# Patient Record
Sex: Female | Born: 1965 | Race: Black or African American | Hispanic: No | Marital: Single | Smoking: Former smoker
Health system: Southern US, Community
[De-identification: ages and names within clinical notes are randomized; demographics above are authoritative.]

## PROBLEM LIST (undated history)

## (undated) DIAGNOSIS — I10 Essential (primary) hypertension: Secondary | ICD-10-CM

## (undated) DIAGNOSIS — D649 Anemia, unspecified: Secondary | ICD-10-CM

## (undated) DIAGNOSIS — T7840XA Allergy, unspecified, initial encounter: Secondary | ICD-10-CM

## (undated) DIAGNOSIS — K5790 Diverticulosis of intestine, part unspecified, without perforation or abscess without bleeding: Secondary | ICD-10-CM

## (undated) HISTORY — DX: Diverticulosis of intestine, part unspecified, without perforation or abscess without bleeding: K57.90

## (undated) HISTORY — DX: Allergy, unspecified, initial encounter: T78.40XA

## (undated) HISTORY — DX: Anemia, unspecified: D64.9

## (undated) HISTORY — PX: FOOT SURGERY: SHX648

---

## 1998-10-14 ENCOUNTER — Other Ambulatory Visit: Admission: RE | Admit: 1998-10-14 | Discharge: 1998-10-14 | Payer: Self-pay | Admitting: *Deleted

## 1999-11-24 ENCOUNTER — Other Ambulatory Visit: Admission: RE | Admit: 1999-11-24 | Discharge: 1999-11-24 | Payer: Self-pay | Admitting: *Deleted

## 2000-12-26 ENCOUNTER — Other Ambulatory Visit: Admission: RE | Admit: 2000-12-26 | Discharge: 2000-12-26 | Payer: Self-pay | Admitting: *Deleted

## 2001-12-30 ENCOUNTER — Other Ambulatory Visit: Admission: RE | Admit: 2001-12-30 | Discharge: 2001-12-30 | Payer: Self-pay | Admitting: *Deleted

## 2003-01-14 ENCOUNTER — Other Ambulatory Visit: Admission: RE | Admit: 2003-01-14 | Discharge: 2003-01-14 | Payer: Self-pay | Admitting: *Deleted

## 2005-05-09 ENCOUNTER — Ambulatory Visit (HOSPITAL_COMMUNITY): Admission: RE | Admit: 2005-05-09 | Discharge: 2005-05-09 | Payer: Self-pay | Admitting: Obstetrics and Gynecology

## 2005-05-21 ENCOUNTER — Inpatient Hospital Stay (HOSPITAL_COMMUNITY): Admission: AD | Admit: 2005-05-21 | Discharge: 2005-05-25 | Payer: Self-pay | Admitting: Obstetrics & Gynecology

## 2011-02-17 ENCOUNTER — Other Ambulatory Visit: Payer: Self-pay | Admitting: Family Medicine

## 2011-02-17 ENCOUNTER — Encounter: Payer: PRIVATE HEALTH INSURANCE | Admitting: Obstetrics & Gynecology

## 2011-02-17 ENCOUNTER — Other Ambulatory Visit: Payer: Self-pay | Admitting: Obstetrics & Gynecology

## 2011-02-17 DIAGNOSIS — Z1231 Encounter for screening mammogram for malignant neoplasm of breast: Secondary | ICD-10-CM

## 2011-02-17 DIAGNOSIS — Z01419 Encounter for gynecological examination (general) (routine) without abnormal findings: Secondary | ICD-10-CM

## 2011-02-18 NOTE — Group Therapy Note (Signed)
Sonia Murphy, BERK NO.:  [id number]  MEDICAL RECORD NO.:  [id number]           PATIENT TYPE:  A  LOCATION:  [facility]                   FACILITY:  [facility]  PHYSICIAN:  Jaynie Collins, MD     DATE OF BIRTH:  02-May-1966  DATE OF SERVICE:  02/17/2011                                 CLINIC NOTE  REASON FOR VISIT:  Annual examination and to establish gynecologic care.  [name] is a 45 year old gravida 1, para 1 with a last menstrual period of January 19, 2011, here to establish gynecologic care.  The patient has not seen a gynecologist in 4 years and she has questions about hot flashes, night sweats, and palpitations.  The patient is not sexually active.  She denies any abnormal bleeding or any abnormal discharge or any other gynecologic concerns.  PAST OB/GYN HISTORY:  The patient had cesarean section several years ago.  She also had a history of abnormal Pap smear 15 years ago.  She was followed by possible cryotherapy.  Subsequent Pap smears have all been normal.  Her last one was 4 years ago.  She does have a history of a sexually transmitted infection, which she thinks is Trichomonas, but she is unsure.  She does not have any other gynecologic conditions.  She does have regular menstrual periods since menarche at age 21.  Her cycles last for 30 days and her periods lasts for 6 days with medium flow and mild pain.  No intermenstrual bleeding.  The patient is sexually active and is not on any contraceptives.  PAST MEDICAL HISTORY:  The patient reports having anemia and pregnancy. No other medical problems.  PAST SURGICAL HISTORY:  Cesarean section.  MEDICATIONS:  None.  ALLERGIES:  PENICILLIN.  SOCIAL HISTORY:  The patient works in Air[employer].  She lives with her daughter.  She does not smoke, drink alcohol, or use any illicit drugs.  FAMILY HISTORY:  Only remarkable for diabetes and high blood pressure in her mother.  She denies any family history of any  cancers.  REVIEW OF SYSTEMS:  The patient reports menopausal symptoms, hot flashes, palpitations, muscle and joint pain.  PHYSICAL EXAMINATION:  VITAL SIGNS:  Temperature 98.4, pulse 85, blood pressure 147/89, weight 221 pounds, height 67.5 inches. GENERAL:  No apparent distress. HEENT:  Normocephalic, atraumatic. NECK:  Supple.  Normal thyroid. LUNGS:  Clear to auscultation bilaterally. HEART:  Regular rate and rhythm. BREASTS:  Symmetric in size, soft, nontender.  No abnormal masses, skin changes, lymphadenopathy, nipple drainage noted.  ABDOMEN:  Obese, soft, nontender.  No organomegaly. EXTREMITIES:  No cyanosis, clubbing, or edema. PELVIC:  Normal external female genitalia.  Pink and well-rugated vagina.  The patient does have malodorous, thin, yellow discharge. Sample was taken for wet prep.  She does have a normal cervical contour. Pap smear sample was obtained.  On bimanual exam, her uterus or adnexa could not be palpated secondary to habitus, but no tenderness was elicited on examination.  ASSESSMENT AND PLAN:  The patient is a 45-year gravida 1, para 1, perimenopausal woman here for well-woman exam.  Pap smear was done today.  We will follow up with the results.  The patient will also be scheduled for mammogram as she has never undergone a mammogram.  As for her perimenopausal symptoms, the patient was told that this is expected and within the rim of normal.  However, there is concern about her palpitations given that she also had an elevated blood pressure today of 147/89.  She is to follow up with her primary care physician as soon as possible.  Her primary care physician is Dr. Burton Apley and she is going to make an appointment.  She was told it is important to rule out a cardiac etiology for her palpitations, but having insomnia, mood swings, night sweats, hot flashes is all normal perimenopausal symptoms. She was told to let us know if her perimenopausal symptoms  become debilitated as she might need further management.  The patient was told to call or come back for any further gynecologic concerns.          ______________________________ Jaynie Collins, MD    UA/MEDQ  D:  02/17/2011  T:  02/18/2011  Job:  [id number]

## 2011-02-28 ENCOUNTER — Ambulatory Visit (HOSPITAL_COMMUNITY)
Admission: RE | Admit: 2011-02-28 | Discharge: 2011-02-28 | Disposition: A | Discharge status: Home / Self Care | Payer: PRIVATE HEALTH INSURANCE | Source: Ambulatory Visit | Attending: Family Medicine | Admitting: Family Medicine

## 2011-02-28 DIAGNOSIS — Z1231 Encounter for screening mammogram for malignant neoplasm of breast: Secondary | ICD-10-CM | POA: Insufficient documentation

## 2011-03-03 ENCOUNTER — Other Ambulatory Visit: Payer: Self-pay | Admitting: Family Medicine

## 2011-03-03 DIAGNOSIS — R928 Other abnormal and inconclusive findings on diagnostic imaging of breast: Secondary | ICD-10-CM

## 2011-03-09 ENCOUNTER — Ambulatory Visit
Admission: RE | Admit: 2011-03-09 | Discharge: 2011-03-09 | Disposition: A | Discharge status: Home / Self Care | Payer: PRIVATE HEALTH INSURANCE | Source: Ambulatory Visit | Attending: Family Medicine | Admitting: Family Medicine

## 2011-03-09 DIAGNOSIS — R928 Other abnormal and inconclusive findings on diagnostic imaging of breast: Secondary | ICD-10-CM

## 2011-12-02 ENCOUNTER — Emergency Department (HOSPITAL_COMMUNITY)
Admission: EM | Admit: 2011-12-02 | Discharge: 2011-12-02 | Disposition: A | Discharge status: Nursing Home (Includes ICF) | Payer: PRIVATE HEALTH INSURANCE | Source: Home / Self Care | Attending: Emergency Medicine | Admitting: Emergency Medicine

## 2011-12-02 ENCOUNTER — Encounter (HOSPITAL_COMMUNITY): Payer: Self-pay | Admitting: Emergency Medicine

## 2011-12-02 ENCOUNTER — Emergency Department (HOSPITAL_COMMUNITY)
Admission: EM | Admit: 2011-12-02 | Discharge: 2011-12-03 | Disposition: A | Discharge status: Home / Self Care | Payer: PRIVATE HEALTH INSURANCE | Attending: Emergency Medicine | Admitting: Emergency Medicine

## 2011-12-02 DIAGNOSIS — J069 Acute upper respiratory infection, unspecified: Secondary | ICD-10-CM | POA: Insufficient documentation

## 2011-12-02 DIAGNOSIS — K5792 Diverticulitis of intestine, part unspecified, without perforation or abscess without bleeding: Secondary | ICD-10-CM

## 2011-12-02 DIAGNOSIS — R1032 Left lower quadrant pain: Secondary | ICD-10-CM | POA: Insufficient documentation

## 2011-12-02 DIAGNOSIS — E279 Disorder of adrenal gland, unspecified: Secondary | ICD-10-CM | POA: Insufficient documentation

## 2011-12-02 DIAGNOSIS — R110 Nausea: Secondary | ICD-10-CM | POA: Insufficient documentation

## 2011-12-02 DIAGNOSIS — Z79899 Other long term (current) drug therapy: Secondary | ICD-10-CM | POA: Insufficient documentation

## 2011-12-02 DIAGNOSIS — R109 Unspecified abdominal pain: Secondary | ICD-10-CM

## 2011-12-02 DIAGNOSIS — K5730 Diverticulosis of large intestine without perforation or abscess without bleeding: Secondary | ICD-10-CM | POA: Insufficient documentation

## 2011-12-02 DIAGNOSIS — I10 Essential (primary) hypertension: Secondary | ICD-10-CM | POA: Insufficient documentation

## 2011-12-02 HISTORY — DX: Essential (primary) hypertension: I10

## 2011-12-02 LAB — DIFFERENTIAL
Basophils Absolute: 0.1 10*3/uL (ref 0.0–0.1)
Basophils Relative: 1 % (ref 0–1)
Eosinophils Absolute: 0.1 10*3/uL (ref 0.0–0.7)
Lymphocytes Relative: 32 % (ref 12–46)
Lymphs Abs: 2.9 10*3/uL (ref 0.7–4.0)
Neutrophils Relative %: 60 % (ref 43–77)

## 2011-12-02 LAB — CBC
RBC: 4.15 MIL/uL (ref 3.87–5.11)
RDW: 11.7 % (ref 11.5–15.5)

## 2011-12-02 LAB — COMPREHENSIVE METABOLIC PANEL
AST: 19 U/L (ref 0–37)
Albumin: 4 g/dL (ref 3.5–5.2)
Alkaline Phosphatase: 45 U/L (ref 39–117)
BUN: 10 mg/dL (ref 6–23)
CO2: 27 mEq/L (ref 19–32)
Calcium: 9.5 mg/dL (ref 8.4–10.5)
Creatinine, Ser: 0.85 mg/dL (ref 0.50–1.10)
Glucose, Bld: 81 mg/dL (ref 70–99)
Potassium: 3.5 mEq/L (ref 3.5–5.1)

## 2011-12-02 LAB — POCT URINALYSIS DIP (DEVICE)
Glucose, UA: NEGATIVE mg/dL
Ketones, ur: NEGATIVE mg/dL
Leukocytes, UA: NEGATIVE
Protein, ur: NEGATIVE mg/dL
Urobilinogen, UA: 0.2 mg/dL (ref 0.0–1.0)
pH: 5.5 (ref 5.0–8.0)

## 2011-12-02 MED ORDER — SODIUM CHLORIDE 0.9 % IV SOLN
1000.0000 mL | Freq: Once | INTRAVENOUS | Status: AC
  Administered 2011-12-02: 1000 mL via INTRAVENOUS

## 2011-12-02 MED ORDER — SODIUM CHLORIDE 0.9 % IV SOLN: 1000.0000 mL | INTRAVENOUS | Status: DC

## 2011-12-02 MED ORDER — IOHEXOL 300 MG/ML  SOLN: 20.0000 mL | INTRAMUSCULAR | Status: AC

## 2011-12-02 NOTE — ED Provider Notes (Signed)
History     CSN: [id number]  Arrival date & time 12/02/11  4098   First MD Initiated Contact with Patient 12/02/11 2012      Chief Complaint  Patient presents with  . Abdominal Pain    (Consider location/radiation/quality/duration/timing/severity/associated sxs/prior treatment) The history is provided by the patient.   Patient presents as a transfer from urgent care with left lower quadrant abdominal pain. This started yesterday. It is described as achy in nature, and worsens with sitting up or bending forward. It has not changed location since onset. It improves with lying flat or standing up. She denies any change in bowel movements. States that she has had 3 small bowel movements today, and has not noticed any blood in her stool with this. Has had flatus. She did have some slight nausea today. States she hasn't eaten much all day because she was afraid she may make the pain worse. Denies any fever or chills. She denies any urinary symptoms or vaginal discharge. Last menstrual period was last week.  Surgical history is significant for a cesarean section; she has otherwise not had any abdominal surgeries.  Past Medical History  Diagnosis Date  . Upper respiratory infection   . Hypertension     Past Surgical History  Procedure Date  . Cesarean section   . Foot surgery     History reviewed. No pertinent family history.  History  Substance Use Topics  . Smoking status: Never Smoker   . Smokeless tobacco: Not on file  . Alcohol Use: Yes    OB History    Grav Para Term Preterm Abortions TAB SAB Ect Mult Living                  Review of Systems  Constitutional: Negative for fever, chills, activity change and appetite change.  HENT: Negative.   Respiratory: Negative.   Cardiovascular: Negative.   Gastrointestinal: Positive for nausea and abdominal pain. Negative for vomiting, diarrhea, constipation and abdominal distention.  Genitourinary: Negative for vaginal  bleeding, vaginal discharge, difficulty urinating, vaginal pain, menstrual problem and pelvic pain.  Musculoskeletal: Negative for myalgias.  Skin: Negative.   Neurological: Negative.     Allergies  Penicillins  Home Medications   Current Outpatient Rx  Name Route Sig Dispense Refill  . CIPROFLOXACIN HCL 250 MG PO TABS Oral Take 250 mg by mouth 2 (two) times daily. Take for 10 days. Began on 12/01/11 should complete on 12/10/11.    Marland Kitchen LISINOPRIL 5 MG PO TABS Oral Take 5 mg by mouth daily.      BP 135/83  Pulse 80  Temp(Src) 98.1 F (36.7 C) (Oral)  Resp 18  SpO2 99%  LMP 11/25/2011  Physical Exam  Nursing note and vitals reviewed. Constitutional: She is oriented to person, place, and time. She appears well-developed and well-nourished. No distress.  HENT:  Head: Normocephalic and atraumatic.  Right Ear: External ear normal.  Left Ear: External ear normal.  Eyes: Conjunctivae and EOM are normal. Pupils are equal, round, and reactive to light.  Neck: Normal range of motion.  Cardiovascular: Normal rate, regular rhythm and normal heart sounds.  Exam reveals no gallop and no friction rub.   No murmur heard. Pulmonary/Chest: Effort normal and breath sounds normal. She exhibits no tenderness.  Abdominal: Soft. Bowel sounds are normal.       Tenderness to palpation over the left lower quadrant. There is no rebound tenderness or guarding noted. Negative Murphy sign.  Musculoskeletal: Normal range of  motion.  Neurological: She is alert and oriented to person, place, and time.  Skin: Skin is warm and dry. She is not diaphoretic.  Psychiatric: She has a normal mood and affect.    ED Course  Procedures (including critical care time)  UA at [facility]: Glucose, UA NEGATIVE Bilirubin Urine NEGATIVE Ketones, ur NEGATIVE Specific Gravity, Urine 1.015 Hgb urine dipstick SMALL (A) pH 5.5 Protein, ur NEGATIVE  Urobilinogen, UA 0.2 Nitrite NEGATIVE  Leukocytes, UA NEGATIVE   Labs  Reviewed  CBC - Abnormal; Notable for the following:    Hemoglobin 11.8 (*)    HCT 35.6 (*)    All other components within normal limits  COMPREHENSIVE METABOLIC PANEL - Abnormal; Notable for the following:    GFR calc non Af Amer 81 (*)    All other components within normal limits  DIFFERENTIAL  LIPASE, BLOOD   Ct Abdomen Pelvis W Contrast  12/03/2011  *RADIOLOGY REPORT*  Clinical Data: Left lower quadrant pain.  CT ABDOMEN AND PELVIS WITH CONTRAST  Technique:  Multidetector CT imaging of the abdomen and pelvis was performed following the standard protocol during bolus administration of intravenous contrast.  Contrast: OMNIPAQUE IOHEXOL 300 MG/ML IV SOLN  Comparison: None.  Findings: Lung bases are clear.  Negative for free air.  There is pericolonic inflammation at the junction of the descending colon and sigmoid colon in the left lower quadrant.  There is a 1.3 cm low density structure along the colon which could represent phlegmonous material.  There is fluid below the inflamed colon. Multiple diverticula involving the left colon.  There is a moderate amount of free fluid within the pelvis.  Normal appearance of the appendix.  Normal appearance of the liver, portal venous system, gallbladder, spleen, kidneys and pancreas.  The right adrenal gland has a normal appearance.  There is a nodule involving the left adrenal gland that measures up to 2 cm along the left lateral limb. The adrenal nodule Hounsfield units on the portal venous phase measures 60 and the Hounsfield units on the delayed images measures 36.  Findings suggest washout of contrast from the adrenal lesion.  No gross abnormality involving the uterus or adnexa tissues.  Disc space loss at L4-L5.  No acute bony abnormality.  IMPRESSION: Pericolonic inflammation in the sigmoid colon and question a small area of phlegmon.  Findings are most compatible with acute diverticulitis.  There is no discrete abscess collection at this time.   Moderate amount of fluid in the pelvis and a small amount of fluid just below the diverticulitis.  2 cm left adrenal nodule.  This lesion is indeterminate based on this imaging but there is evidence for contrast washout.  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  Original Report Authenticated By: Richarda Overlie, M.D.   I personally reviewed the imaging study.   1. Diverticulitis       MDM  Pt with LLQ abd pain which started yesterday. Her labs are generally unremarkable. A CT abd/pelvis was performed which showed diverticulitis without evidence for abscess, in addition to an adrenal nodule. Findings discussed with pt. Will place on cipro/flagyl. Instructed to make f/u with PCP this week for recheck. Return precautions discussed.        Grant Fontana, [place] 12/03/11 [phone]

## 2011-12-02 NOTE — ED Notes (Signed)
Onset of abdominal pain was yesterday.  Pain is lower, left abdomen.  Last bowel movement was today, normal per patient .  bm did not impact pain at all.  Denies burning with urination, does fel pressure, denies urgency or frequency. No nausea, no vomiting.

## 2011-12-02 NOTE — ED Provider Notes (Addendum)
History     CSN: [id number]  Arrival date & time 12/02/11  1632   First MD Initiated Contact with Patient 12/02/11 1736      Chief Complaint  Patient presents with  . Abdominal Pain    (Consider location/radiation/quality/duration/timing/severity/associated sxs/prior treatment) HPI Comments: Patient presents urgent care complaining of left lower quadrant pain since yesterday have been having bowel movements as normal and actually today he had 3 with no changes and no bleeding. Patient denies any burning with urination. Patient also described had been taking Cipro for about 6-7 days for an upper respiratory infection as she is allergic to penicillin.  No fevers although described that yesterday he did feel like she was feverish with chills.  Patient is a 46 y.o. female presenting with abdominal pain. The history is provided by the patient.  Abdominal Pain The primary symptoms of the illness include abdominal pain. The primary symptoms of the illness do not include fever, fatigue, shortness of breath, nausea, vomiting, diarrhea, hematemesis, dysuria or vaginal bleeding. The current episode started yesterday. The problem has been gradually worsening.  Additional symptoms associated with the illness include chills and anorexia. Symptoms associated with the illness do not include constipation, urgency, frequency or back pain.    Past Medical History  Diagnosis Date  . Upper respiratory infection   . Hypertension     Past Surgical History  Procedure Date  . Cesarean section   . Foot surgery     No family history on file.  History  Substance Use Topics  . Smoking status: Never Smoker   . Smokeless tobacco: Not on file  . Alcohol Use: Yes    OB History    Grav Para Term Preterm Abortions TAB SAB Ect Mult Living                  Review of Systems  Constitutional: Positive for chills. Negative for fever, activity change and fatigue.  Respiratory: Negative for cough and  shortness of breath.   Gastrointestinal: Positive for abdominal pain and anorexia. Negative for nausea, vomiting, diarrhea, constipation and hematemesis.  Genitourinary: Negative for dysuria, urgency, frequency and vaginal bleeding.  Musculoskeletal: Negative for back pain.    Allergies  Penicillins  Home Medications   Current Outpatient Rx  Name Route Sig Dispense Refill  . CIPROFLOXACIN HCL 250 MG PO TABS Oral Take 250 mg by mouth 2 (two) times daily. Take for 10 days. Began on 12/01/11 should complete on 12/10/11.    Marland Kitchen LISINOPRIL 5 MG PO TABS Oral Take 5 mg by mouth daily.      BP 158/89  Pulse 80  Temp(Src) 97.8 F (36.6 C) (Oral)  Resp 18  SpO2 100%  LMP 11/25/2011  Physical Exam  Nursing note and vitals reviewed. Constitutional: She appears well-developed and well-nourished. No distress.  HENT:  Head: Normocephalic.  Eyes: Conjunctivae are normal.  Neck: Neck supple. No JVD present.  Pulmonary/Chest: Effort normal.  Abdominal: Soft. There is no hepatomegaly. There is tenderness in the suprapubic area and left lower quadrant. There is no rigidity, no rebound, no guarding, no CVA tenderness, no tenderness at McBurney's point and negative Murphy's sign.    Lymphadenopathy:    She has no cervical adenopathy.    ED Course  Procedures (including critical care time)  Labs Reviewed  POCT URINALYSIS DIP (DEVICE) - Abnormal; Notable for the following:    Hgb urine dipstick SMALL (*)    All other components within normal limits   No  results found.   No diagnosis found.    MDM  Patient presents with left lower quadrant pain for about 24 hours reproducible with palpation and movement. No nausea vomiting or diarrhea does. No dysuria. Based on exam consider patient will need further evaluation and perhaps to discretion of the ED provider  imaging to rule out diverticular disease or internal to condition explained to patient that further evaluation depends on the  diagnostic impression of the provider to no guarantees of specific studies have been offered.Jimmie Molly, MD 12/02/11 1840  Jimmie Molly, MD 12/02/11 2017

## 2011-12-02 NOTE — ED Notes (Signed)
Patient from Urgent Care; complaining of left lower abdominal pain that started yesterday; denies nausea and vomiting.  Reports a "pressure" when she urinates; denies other urinary changes.

## 2011-12-03 ENCOUNTER — Emergency Department (HOSPITAL_COMMUNITY): Payer: PRIVATE HEALTH INSURANCE

## 2011-12-03 ENCOUNTER — Encounter (HOSPITAL_COMMUNITY): Payer: Self-pay | Admitting: Radiology

## 2011-12-03 MED ORDER — METRONIDAZOLE 500 MG PO TABS: 500.0000 mg | ORAL_TABLET | Freq: Three times a day (TID) | ORAL | Status: AC

## 2011-12-03 MED ORDER — HYDROCODONE-ACETAMINOPHEN 5-325 MG PO TABS: 1.0000 | ORAL_TABLET | ORAL | Status: AC | PRN

## 2011-12-03 MED ORDER — IOHEXOL 300 MG/ML  SOLN
100.0000 mL | Freq: Once | INTRAMUSCULAR | Status: AC | PRN
  Administered 2011-12-03: 100 mL via INTRAVENOUS

## 2011-12-03 MED ORDER — CIPROFLOXACIN HCL 250 MG PO TABS: 250.0000 mg | ORAL_TABLET | Freq: Two times a day (BID) | ORAL | Status: AC

## 2011-12-03 NOTE — ED Provider Notes (Signed)
Medical screening examination/treatment/procedure(s) were conducted as a shared visit with non-physician practitioner(s) and myself.  I personally evaluated the patient during the encounter  Abdomen soft, mild/moderate left lower quadrant tenderness without peritoneal signs. The patient's CT findings and diagnoses were discussed with her.   Hanley Seamen, MD 12/03/11 [phone]

## 2011-12-03 NOTE — Discharge Instructions (Signed)
Your CT scan showed diverticulitis, which is an inflammation of a pouch in your colon. You have been given prescriptions for 2 antibiotics: Cipro and Flagyl to take for the next week. Take ALL of these as prescribed.  Your CT did show a nodule on your adrenal gland. These are usually not cancerous in nature but your regular doctor may want to get a follow up scan.   It is important to follow up with your primary care doctor next week for a recheck. If you have increasing pain, nausea/vomiting, high fever, or other worrisome symptoms, please return to the ER.  Diverticulitis A diverticulum is a small pouch or sac on the colon. Diverticulosis is the presence of these diverticula on the colon. Diverticulitis is the irritation (inflammation) or infection of diverticula. CAUSES  The colon and its diverticula contain bacteria. If food particles block the tiny opening to a diverticulum, the bacteria inside can grow and cause an increase in pressure. This leads to infection and inflammation and is called diverticulitis. SYMPTOMS   Abdominal pain and tenderness. Usually, the pain is located on the left side of your abdomen. However, it could be located elsewhere.   Fever.   Bloating.   Feeling sick to your stomach (nausea).   Throwing up (vomiting).   Abnormal stools.  DIAGNOSIS  Your caregiver will take a history and perform a physical exam. Since many things can cause abdominal pain, other tests may be necessary. Tests may include:  Blood tests.   Urine tests.   X-ray of the abdomen.   CT scan of the abdomen.  Sometimes, surgery is needed to determine if diverticulitis or other conditions are causing your symptoms. TREATMENT  Most of the time, you can be treated without surgery. Treatment includes:  Resting the bowels by only having liquids for a few days. As you improve, you will need to eat a low-fiber diet.   Intravenous (IV) fluids if you are losing body fluids (dehydrated).    Antibiotic medicines that treat infections may be given.   Pain and nausea medicine, if needed.   Surgery if the inflamed diverticulum has burst.  HOME CARE INSTRUCTIONS   Try a clear liquid diet (broth, tea, or water for as long as directed by your caregiver). You may then gradually begin a low-fiber diet as tolerated. A low-fiber diet is a diet with less than 10 grams of fiber. Choose the foods below to reduce fiber in the diet:   White breads, cereals, rice, and pasta.   Cooked fruits and vegetables or soft fresh fruits and vegetables without the skin.   Ground or well-cooked tender beef, ham, veal, lamb, pork, or poultry.   Eggs and seafood.   After your diverticulitis symptoms have improved, your caregiver may put you on a high-fiber diet. A high-fiber diet includes 14 grams of fiber for every 1000 calories consumed. For a standard 2000 calorie diet, you would need 28 grams of fiber. Follow these diet guidelines to help you increase the fiber in your diet. It is important to slowly increase the amount fiber in your diet to avoid gas, constipation, and bloating.   Choose whole-grain breads, cereals, pasta, and brown rice.   Choose fresh fruits and vegetables with the skin on. Do not overcook vegetables because the more vegetables are cooked, the more fiber is lost.   Choose more nuts, seeds, legumes, dried peas, beans, and lentils.   Look for food products that have greater than 3 grams of fiber per serving  on the Nutrition Facts label.   Take all medicine as directed by your caregiver.   If your caregiver has given you a follow-up appointment, it is very important that you go. Not going could result in lasting (chronic) or permanent injury, pain, and disability. If there is any problem keeping the appointment, call to reschedule.  SEEK MEDICAL CARE IF:   Your pain does not improve.   You have a hard time advancing your diet beyond clear liquids.   Your bowel movements do  not return to normal.  SEEK IMMEDIATE MEDICAL CARE IF:   Your pain becomes worse.   You have an oral temperature above 102 F (38.9 C), not controlled by medicine.   You have repeated vomiting.   You have bloody or black, tarry stools.   Symptoms that brought you to your caregiver become worse or are not getting better.  MAKE SURE YOU:   Understand these instructions.   Will watch your condition.   Will get help right away if you are not doing well or get worse.  Document Released: 07/12/2005 Document Revised: 06/14/2011 Document Reviewed: 11/07/2010 [facility] Patient Information 2012 [place], [place].

## 2011-12-21 ENCOUNTER — Ambulatory Visit (INDEPENDENT_AMBULATORY_CARE_PROVIDER_SITE_OTHER): Payer: PRIVATE HEALTH INSURANCE | Admitting: Gynecology

## 2011-12-21 ENCOUNTER — Encounter: Payer: Self-pay | Admitting: Gynecology

## 2011-12-21 ENCOUNTER — Other Ambulatory Visit: Payer: Self-pay | Admitting: Gynecology

## 2011-12-21 ENCOUNTER — Other Ambulatory Visit (HOSPITAL_COMMUNITY)
Admission: RE | Admit: 2011-12-21 | Discharge: 2011-12-21 | Disposition: A | Discharge status: Home / Self Care | Payer: PRIVATE HEALTH INSURANCE | Source: Ambulatory Visit | Attending: Gynecology | Admitting: Gynecology

## 2011-12-21 VITALS — BP 124/84 | Ht 67.75 in | Wt 221.0 lb

## 2011-12-21 DIAGNOSIS — N899 Noninflammatory disorder of vagina, unspecified: Secondary | ICD-10-CM

## 2011-12-21 DIAGNOSIS — Z01419 Encounter for gynecological examination (general) (routine) without abnormal findings: Secondary | ICD-10-CM | POA: Insufficient documentation

## 2011-12-21 DIAGNOSIS — N898 Other specified noninflammatory disorders of vagina: Secondary | ICD-10-CM

## 2011-12-21 LAB — URINALYSIS W MICROSCOPIC + REFLEX CULTURE
Bilirubin Urine: NEGATIVE
Casts: NONE SEEN
Crystals: NONE SEEN
Ketones, ur: NEGATIVE mg/dL
Leukocytes, UA: NEGATIVE
Urobilinogen, UA: 0.2 mg/dL (ref 0.0–1.0)
pH: 5.5 (ref 5.0–8.0)

## 2011-12-21 LAB — WET PREP FOR TRICH, YEAST, CLUE
Trich, Wet Prep: NONE SEEN
Yeast Wet Prep HPF POC: NONE SEEN

## 2011-12-21 MED ORDER — FLUCONAZOLE 150 MG PO TABS: 150.0000 mg | ORAL_TABLET | Freq: Once | ORAL | Status: AC

## 2011-12-21 MED ORDER — CLINDAMYCIN PHOSPHATE 2 % VA CREA: 1.0000 | TOPICAL_CREAM | Freq: Every day | VAGINAL | Status: AC

## 2011-12-21 NOTE — Progress Notes (Signed)
Sonia Murphy 01-Jul-1966 [id number]        46 y.o.  for annual exam.  Has not been seen in a number of years. Overall doing well but does note some vaginal vulvar irritation noting that she's been on several courses of antibiotics for respiratory and diverticulitis issues.  Past medical history,surgical history, medications, allergies, family history and social history were all reviewed and documented in the EPIC chart. ROS:  Was performed and pertinent positives and negatives are included in the history.  Exam: Sherrilyn Rist chaperone present Filed Vitals:   12/21/11 1107  BP: 124/84   General appearance  Normal Skin grossly normal Head/Neck normal with no cervical or supraclavicular adenopathy thyroid normal Lungs  clear Cardiac RR, without RMG Abdominal  soft, nontender, without masses, organomegaly or hernia Breasts  examined lying and sitting without masses, retractions, discharge or axillary adenopathy. Pelvic  Ext/BUS/vagina  normal with slight white discharge noted  Cervix  normal  Pap done  Uterus  axial, normal size, shape and contour, midline and mobile nontender   Adnexa  Without masses or tenderness    Anus and perineum  normal   Rectovaginal  normal sphincter tone without palpated masses or tenderness.    Assessment/Plan:  46 y.o. female for annual exam.    1. Vulvar pruritus. Wet prep consistent with PVD. Given her history of antibiotic use also to cover her for yeast with Diflucan 150x1 dose and Cleocin vaginal cream nightly x1 week. Follow up if symptoms persist or recur. 2. Microscopic hematuria. I did a UA today which did show 7-10 RBCs. She notes that she was evaluated last year by urology for microscopic hematuria with a did an ultrasound and cystoscopy and told her that was normal and that does not worry about it. 3. Birth control. Patient's not currently sexually active. She does use condoms when she is. I reviewed the failure risk with this and the availability of  Plan B. She is comfortable with this and requests no other contraception. 4. Mammogram. Patient is coming due for her mammogram in May and I reminded her to schedule this. SBE monthly reviewed. 5. Pap smear. She does have a Pap smear report from May 2012 and her EMR and went ahead and did one today. As we have no other reports. She has no history of abnormal cytology historically. 6. Health maintenance. She normally sees Dr. Su Hilt for routine health maintenance in follow up of her blood pressure and he does all of her routine blood work I did no blood work today. Assuming that the patient continues well from a gynecologic standpoint and she will see me in a year, sooner as needed.    Dara Lords MD, 11:55 AM 12/21/2011

## 2011-12-21 NOTE — Patient Instructions (Signed)
Use antibiotics as prescribed. Follow up if vaginal symptoms persist. Follow up in one year for annual gynecologic exam.

## 2012-01-19 ENCOUNTER — Other Ambulatory Visit: Payer: Self-pay | Admitting: Gynecology

## 2012-01-19 DIAGNOSIS — Z1231 Encounter for screening mammogram for malignant neoplasm of breast: Secondary | ICD-10-CM

## 2012-03-12 ENCOUNTER — Ambulatory Visit (HOSPITAL_COMMUNITY)
Admission: RE | Admit: 2012-03-12 | Discharge: 2012-03-12 | Disposition: A | Discharge status: Home / Self Care | Payer: PRIVATE HEALTH INSURANCE | Source: Ambulatory Visit | Attending: Gynecology | Admitting: Gynecology

## 2012-03-12 DIAGNOSIS — Z1231 Encounter for screening mammogram for malignant neoplasm of breast: Secondary | ICD-10-CM | POA: Insufficient documentation

## 2012-07-05 IMAGING — MG MM DIGITAL DIAG LTD L {BCG}
2 series · 2 of 2 positions shown · non-contrast
Comparison: Baseline screening mammogram 02/28/2011

CLINICAL DATA: Possible mass of breast identified on recent
baseline screening mammogram.

DIGITAL DIAGNOSTIC LEFT MAMMOGRAM WITHOUT CAD AND LEFT BREAST
ULTRASOUND:

[L CC]
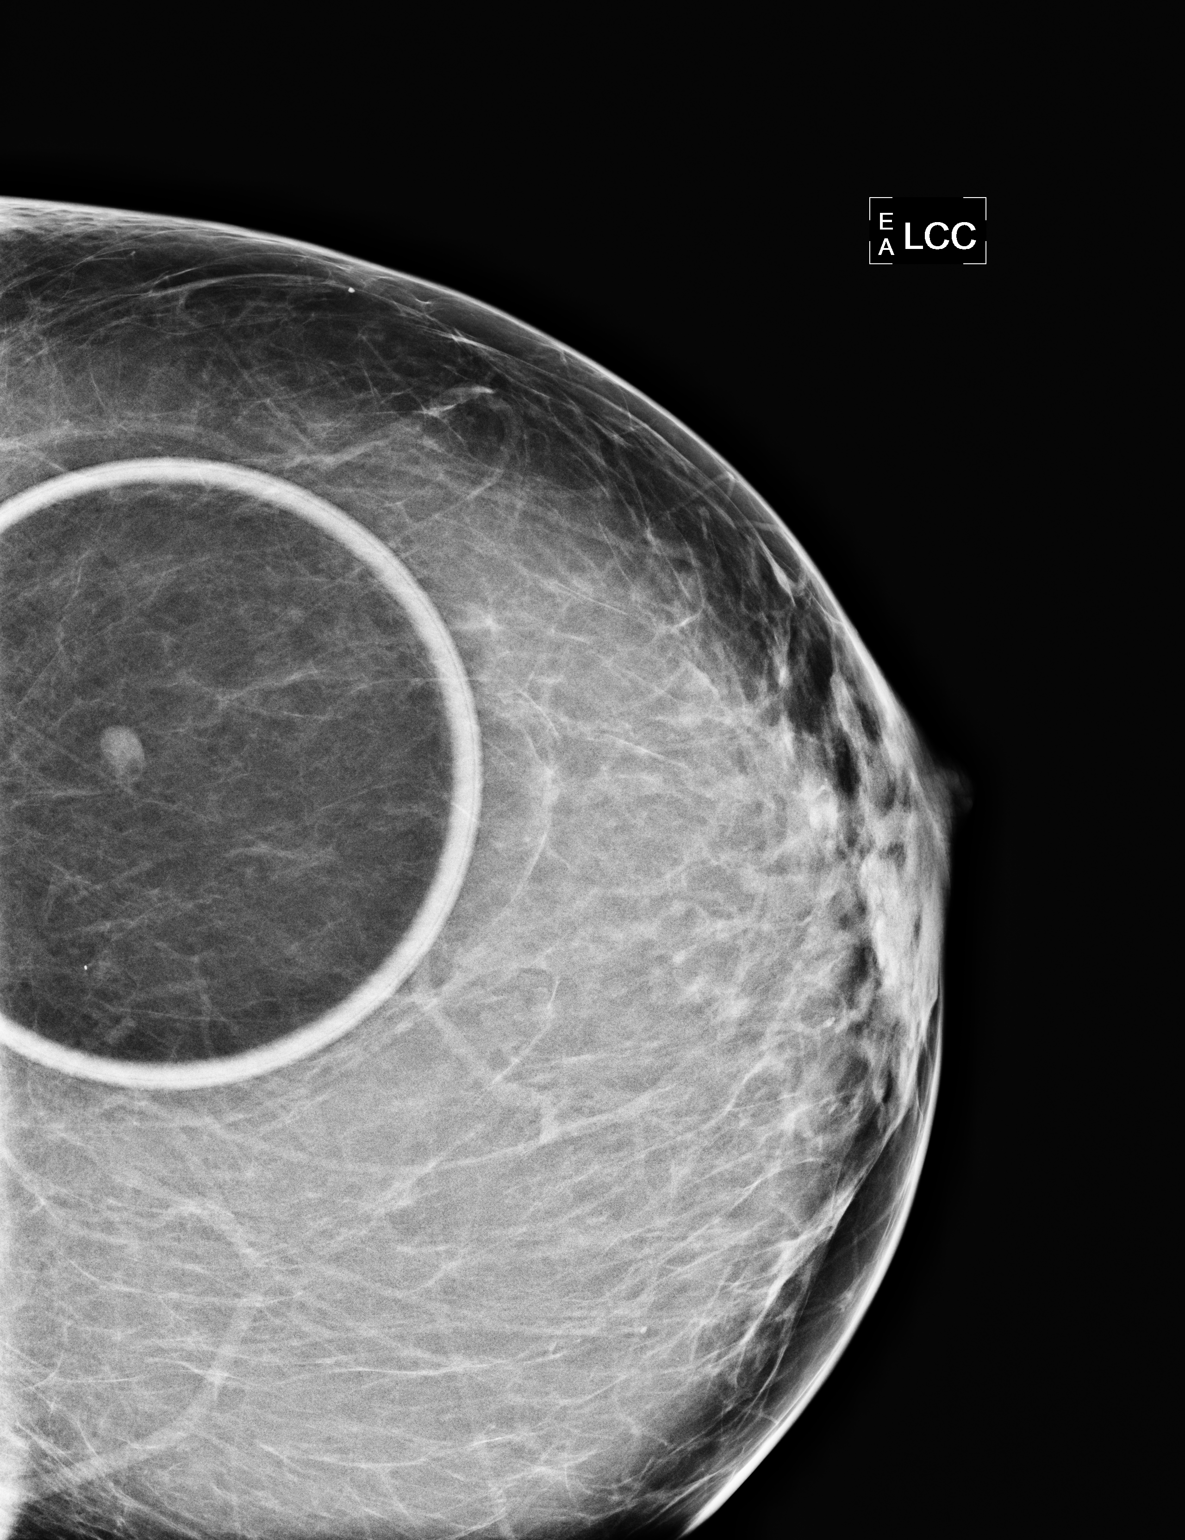

[L MLO]
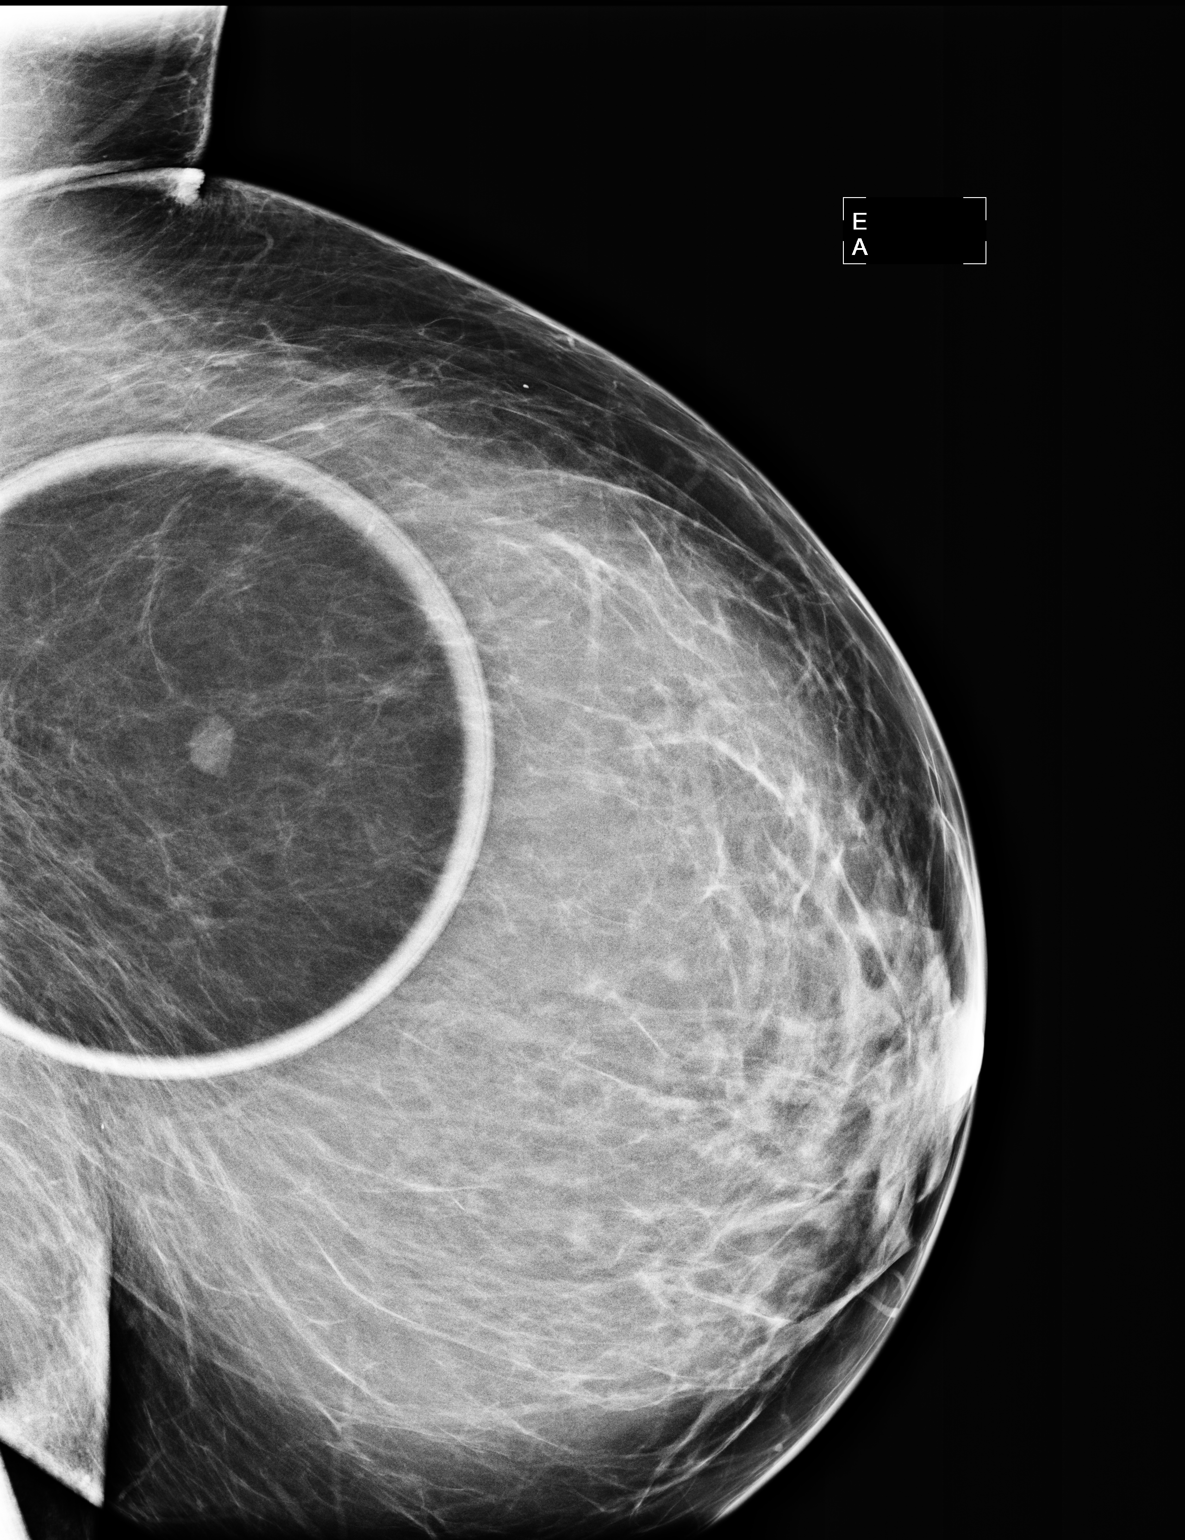

[2 of 2 positions shown; findings below may reference images not displayed]

FINDINGS: Focal spot compression view of the outer left breast
shows a circumscribed 7 mm nodule, with a fatty notch seen
medially, in the CC projection.  Appearances are most suggestive of
benign intramammary lymph node.

On physical exam, no mass is palpated in the outer left breast.

Ultrasound is performed, showing normal fibroglandular breast
tissue.  No solid or cystic mass is identified.  It is suspected
that the lymph node is nearly isoechoic to the breast parenchyma on
ultrasound, making it difficult to visualize.
IMPRESSION: Benign intramammary lymph node left breast.  No evidence of
malignancy.  Bilateral screening mammogram in 1 year is
recommended.

BI-RADS CATEGORY 2:  Benign finding(s).

## 2012-12-23 ENCOUNTER — Encounter: Payer: PRIVATE HEALTH INSURANCE | Admitting: Gynecology

## 2012-12-31 ENCOUNTER — Encounter: Payer: PRIVATE HEALTH INSURANCE | Admitting: Gynecology

## 2013-01-09 ENCOUNTER — Encounter: Payer: Self-pay | Admitting: Gynecology

## 2013-01-09 ENCOUNTER — Ambulatory Visit (INDEPENDENT_AMBULATORY_CARE_PROVIDER_SITE_OTHER): Payer: BC Managed Care – PPO | Admitting: Gynecology

## 2013-01-09 VITALS — BP 134/84 | Ht 67.0 in | Wt 234.0 lb

## 2013-01-09 DIAGNOSIS — Z01419 Encounter for gynecological examination (general) (routine) without abnormal findings: Secondary | ICD-10-CM

## 2013-01-09 DIAGNOSIS — N393 Stress incontinence (female) (male): Secondary | ICD-10-CM

## 2013-01-09 NOTE — Progress Notes (Signed)
Sonia Murphy 12-Jan-1966 [id number]        47 y.o.  G1P1001 for annual exam.  Complaining of of SUI symptoms.  Past medical history,surgical history, medications, allergies, family history and social history were all reviewed and documented in the EPIC chart. ROS:  Was performed and pertinent positives and negatives are included in the history.  Exam: Kim assistant Filed Vitals:   01/09/13 1112  BP: 134/84  Height: 5\' 7"  (1.702 m)  Weight: 234 lb (106.142 kg)   General appearance  Normal Skin grossly normal Head/Neck normal with no cervical or supraclavicular adenopathy thyroid normal Lungs  clear Cardiac RR, without RMG Abdominal  soft, nontender, without masses, organomegaly or hernia Breasts  examined lying and sitting without masses, retractions, discharge or axillary adenopathy. Pelvic  Ext/BUS/vagina  normal   Cervix  normal   Uterus  axial, normal size, shape and contour, midline and mobile nontender   Adnexa  Without masses or tenderness    Anus and perineum  normal   Rectovaginal  normal sphincter tone without palpated masses or tenderness.    Assessment/Plan:  47 y.o. G35P1001 female for annual exam, regular menses, abstinence birth control.   1. Stress urinary incontinence symptoms. Patient with classic symptoms of loss of urine with coughing sneezing laughing. Not overly bothersome to the patient. No urgency or UTI symptoms. Reviewed options to include behavior modification, Kegel exercise, surgery. Patient's not interested in surgery. We'll attempt behavior modification along with Kegel exercise. Followup as needed for this. Check baseline urinalysis. 2. Contraception. Not an issue at this time. She knows to represent for discussion of options if she decides to become sexually active. 3. Mammography 02/2012. Nose to followup this May for repeat mammogram. SBE monthly reviewed. 4. Pap smear 2013. No Pap smear done today. No history of abnormal Pap smears. Reviewed  current screening guidelines we'll plan repeat a 3 year interval. 5. Health maintenance. No blood work done as it is all done through her primary physician's office. Followup one year, sooner as needed.    Dara Lords MD, 11:48 AM 01/09/2013

## 2013-01-09 NOTE — Patient Instructions (Addendum)
Try Kegal exercises for the urinary incontinence Follow up in one year for annual exam

## 2013-01-10 LAB — URINALYSIS W MICROSCOPIC + REFLEX CULTURE
Glucose, UA: NEGATIVE mg/dL
Ketones, ur: NEGATIVE mg/dL
Protein, ur: NEGATIVE mg/dL
Specific Gravity, Urine: 1.019 (ref 1.005–1.030)
pH: 5.5 (ref 5.0–8.0)

## 2013-02-18 ENCOUNTER — Other Ambulatory Visit: Payer: Self-pay | Admitting: Gynecology

## 2013-02-18 DIAGNOSIS — Z1231 Encounter for screening mammogram for malignant neoplasm of breast: Secondary | ICD-10-CM

## 2013-03-13 ENCOUNTER — Ambulatory Visit (HOSPITAL_COMMUNITY): Payer: BC Managed Care – PPO | Attending: Gynecology

## 2013-03-27 ENCOUNTER — Ambulatory Visit (HOSPITAL_COMMUNITY)
Admission: RE | Admit: 2013-03-27 | Discharge: 2013-03-27 | Disposition: A | Discharge status: Home / Self Care | Payer: BC Managed Care – PPO | Source: Ambulatory Visit | Attending: Gynecology | Admitting: Gynecology

## 2013-03-27 DIAGNOSIS — Z1231 Encounter for screening mammogram for malignant neoplasm of breast: Secondary | ICD-10-CM | POA: Insufficient documentation

## 2013-05-21 ENCOUNTER — Other Ambulatory Visit: Payer: Self-pay | Admitting: Internal Medicine

## 2013-05-21 ENCOUNTER — Ambulatory Visit
Admission: RE | Admit: 2013-05-21 | Discharge: 2013-05-21 | Disposition: A | Discharge status: Home / Self Care | Payer: BC Managed Care – PPO | Source: Ambulatory Visit | Attending: Internal Medicine | Admitting: Internal Medicine

## 2013-05-21 DIAGNOSIS — R05 Cough: Secondary | ICD-10-CM

## 2013-11-27 ENCOUNTER — Encounter: Payer: Self-pay | Admitting: Gynecology

## 2013-11-27 ENCOUNTER — Ambulatory Visit (INDEPENDENT_AMBULATORY_CARE_PROVIDER_SITE_OTHER): Payer: BC Managed Care – PPO | Admitting: Gynecology

## 2013-11-27 DIAGNOSIS — R102 Pelvic and perineal pain: Secondary | ICD-10-CM

## 2013-11-27 DIAGNOSIS — N949 Unspecified condition associated with female genital organs and menstrual cycle: Secondary | ICD-10-CM

## 2013-11-27 LAB — URINALYSIS W MICROSCOPIC + REFLEX CULTURE
BILIRUBIN URINE: NEGATIVE
CASTS: NONE SEEN
GLUCOSE, UA: NEGATIVE mg/dL
Ketones, ur: NEGATIVE mg/dL
Nitrite: NEGATIVE
PH: 6 (ref 5.0–8.0)
PROTEIN: NEGATIVE mg/dL
Specific Gravity, Urine: 1.025 (ref 1.005–1.030)
Urobilinogen, UA: 1 mg/dL (ref 0.0–1.0)

## 2013-11-27 NOTE — Progress Notes (Signed)
Sonia SaxBridgette D Murphy [birth year] [id number]        48 y.o.  G1P1001   Several day history of left lower quadrant discomfort. Nagging and aching in character. Comes and goes. No nausea vomiting diarrhea constipation. No frequency dysuria urgency or suprapubic discomfort. Does have history of diverticulitis in the past treated through an ER visit but no gastroenterology followup.  Past medical history,surgical history, problem list, medications, allergies, family history and social history were all reviewed and documented in the EPIC chart.  Exam: Kim assistant General appearance  Normal  Spine straight without CVA tenderness. Abdomen soft nontender without masses guarding rebound organomegaly. Pelvic external BUS vagina normal. Cervix normal. Uterus normal size midline mobile nontender. Adnexa without masses or tenderness.  Assessment/Plan:  48 y.o. G1P1001  with left lower quadrant discomfort, nonacute over the past several days. Exam is unremarkable. Check urinalysis and GYN ultrasound rule out nonpalpable abnormalities. Ultrasound normal consider referral to gastroenterology if pain continues.   Note: This document was prepared with digital dictation and possible smart phrase technology. Any transcriptional errors that result from this process are unintentional.   Dara LordsFONTAINE,Nekeisha Aure P MD, 11:43 AM 11/27/2013

## 2013-11-27 NOTE — Patient Instructions (Signed)
Follow up for ultrasound as scheduled 

## 2013-11-28 LAB — URINE CULTURE
Colony Count: NO GROWTH
ORGANISM ID, BACTERIA: NO GROWTH

## 2013-12-01 ENCOUNTER — Telehealth: Payer: Self-pay | Admitting: Gynecology

## 2013-12-01 DIAGNOSIS — R102 Pelvic and perineal pain: Secondary | ICD-10-CM

## 2013-12-01 NOTE — Telephone Encounter (Signed)
Tell patient her urinalysis looked contaminated. Recommend repeating a clean catch urinalysis when she comes in for her ultrasound.

## 2013-12-03 NOTE — Telephone Encounter (Signed)
Pt informed the below,order placed.

## 2013-12-05 ENCOUNTER — Other Ambulatory Visit: Payer: BC Managed Care – PPO

## 2014-01-13 ENCOUNTER — Encounter: Payer: Self-pay | Admitting: Gynecology

## 2014-01-13 ENCOUNTER — Ambulatory Visit (INDEPENDENT_AMBULATORY_CARE_PROVIDER_SITE_OTHER): Payer: BC Managed Care – PPO | Admitting: Gynecology

## 2014-01-13 VITALS — BP 136/80 | Ht 67.0 in | Wt 242.0 lb

## 2014-01-13 DIAGNOSIS — Z01419 Encounter for gynecological examination (general) (routine) without abnormal findings: Secondary | ICD-10-CM

## 2014-01-13 NOTE — Progress Notes (Signed)
Sonia Murphy 17-Feb-1966 [id number]        48 y.o.  G1P1001 for annual exam.  Several issues noted below.  Past medical history,surgical history, problem list, medications, allergies, family history and social history were all reviewed and documented in the EPIC chart.  ROS:  Performed and pertinent positives and negatives are included in the history, assessment and plan .  Exam: Kim assistant Filed Vitals:   01/13/14 0855  BP: 136/80  Height: 5\' 7"  (1.702 m)  Weight: 242 lb (109.77 kg)   General appearance  Normal Skin grossly normal Head/Neck normal with no cervical or supraclavicular adenopathy thyroid normal Lungs  clear Cardiac RR, without RMG Abdominal  soft, nontender, without masses, organomegaly or hernia Breasts  examined lying and sitting without masses, retractions, discharge or axillary adenopathy. Pelvic  Ext/BUS/vagina normal  Cervix normal  Uterus axial to anteverted, normal size, shape and contour, midline and mobile nontender   Adnexa  Without masses or tenderness    Anus and perineum  Normal   Rectovaginal  Normal sphincter tone without palpated masses or tenderness.    Assessment/Plan:  [name] female for annual exam with monthly menses, condom contraception.   1. Mild menstrual irregularity. Patient notes her periods will vary by several days each month. Normal flow. No prolonged or atypical bleeding or skips. Will keep menstrual calendar as long as monthly menses will follow. 2. Recent evaluation for abdominal pain. Was to do ultrasound but never followed up for this. Notes that her pain resolved. She felt it was due to her diverticulitis history. Followup if any recurrence in her pain. 3. SUI. Continues to have some loss of urine. We've discussed previously options and patient is not interested in surgery. We'll continue to monitor. Check urinalysis today. 4. Contraceptive management. Patient has become sexually active using condoms. Reviewed  contraceptive options to include pill patch ring nexplanon Depo-Provera IUD sterilization. Patient's comfortable with condoms at present. Failure risk reviewed. Availability of plan B. Followup if wants to discuss alternatives. 5. Pap smear 2013. No Pap smear done today. No history of significant abnormal Pap smears. Plan repeat Pap smear next year a 3 year interval. 6. Mammography 03/2013. Repeat mammogram this coming June and patient knows to schedule. Does have some issues with recurrent boils around the time of her menses on her breasts. No persistent abnormalities. Hormonal etiology reviewed with the patient. Knows to report any persistent abnormalities on self-exam. SBE monthly reviewed. 7. Health maintenance. No blood work done as this is all done through her primary physician's office. Followup one year, sooner as needed.   Note: This document was prepared with digital dictation and possible smart phrase technology. Any transcriptional errors that result from this process are unintentional.   Dara LordsFONTAINE,TIMOTHY P MD, 9:22 AM 01/13/2014

## 2014-01-13 NOTE — Patient Instructions (Signed)
Followup in one year for annual exam. Followup sooner if you want to discuss contraceptive options.  Contraception Choices Contraception (birth control) is the use of any methods or devices to prevent pregnancy. Below are some methods to help avoid pregnancy. HORMONAL METHODS   Contraceptive implant This is a thin, plastic tube containing progesterone hormone. It does not contain estrogen hormone. Your health care provider inserts the tube in the inner part of the upper arm. The tube can remain in place for up to 3 years. After 3 years, the implant must be removed. The implant prevents the ovaries from releasing an egg (ovulation), thickens the cervical mucus to prevent sperm from entering the uterus, and thins the lining of the inside of the uterus.  Progesterone-only injections These injections are given every 3 months by your health care provider to prevent pregnancy. This synthetic progesterone hormone stops the ovaries from releasing eggs. It also thickens cervical mucus and changes the uterine lining. This makes it harder for sperm to survive in the uterus.  Birth control pills These pills contain estrogen and progesterone hormone. They work by preventing the ovaries from releasing eggs (ovulation). They also cause the cervical mucus to thicken, preventing the sperm from entering the uterus. Birth control pills are prescribed by a health care provider.Birth control pills can also be used to treat heavy periods.  Minipill This type of birth control pill contains only the progesterone hormone. They are taken every day of each month and must be prescribed by your health care provider.  Birth control patch The patch contains hormones similar to those in birth control pills. It must be changed once a week and is prescribed by a health care provider.  Vaginal ring The ring contains hormones similar to those in birth control pills. It is left in the vagina for 3 weeks, removed for 1 week, and then a  new one is put back in place. The patient must be comfortable inserting and removing the ring from the vagina.A health care provider's prescription is necessary.  Emergency contraception Emergency contraceptives prevent pregnancy after unprotected sexual intercourse. This pill can be taken right after sex or up to 5 days after unprotected sex. It is most effective the sooner you take the pills after having sexual intercourse. Most emergency contraceptive pills are available without a prescription. Check with your pharmacist. Do not use emergency contraception as your only form of birth control. BARRIER METHODS   Female condom This is a thin sheath (latex or rubber) that is worn over the penis during sexual intercourse. It can be used with spermicide to increase effectiveness.  Female condom. This is a soft, loose-fitting sheath that is put into the vagina before sexual intercourse.  Diaphragm This is a soft, latex, dome-shaped barrier that must be fitted by a health care provider. It is inserted into the vagina, along with a spermicidal jelly. It is inserted before intercourse. The diaphragm should be left in the vagina for 6 to 8 hours after intercourse.  Cervical cap This is a round, soft, latex or plastic cup that fits over the cervix and must be fitted by a health care provider. The cap can be left in place for up to 48 hours after intercourse.  Sponge This is a soft, circular piece of polyurethane foam. The sponge has spermicide in it. It is inserted into the vagina after wetting it and before sexual intercourse.  Spermicides These are chemicals that kill or block sperm from entering the cervix and uterus.  They come in the form of creams, jellies, suppositories, foam, or tablets. They do not require a prescription. They are inserted into the vagina with an applicator before having sexual intercourse. The process must be repeated every time you have sexual intercourse. INTRAUTERINE  CONTRACEPTION  Intrauterine device (IUD) This is a T-shaped device that is put in a woman's uterus during a menstrual period to prevent pregnancy. There are 2 types:  Copper IUD This type of IUD is wrapped in copper wire and is placed inside the uterus. Copper makes the uterus and fallopian tubes produce a fluid that kills sperm. It can stay in place for 10 years.  Hormone IUD This type of IUD contains the hormone progestin (synthetic progesterone). The hormone thickens the cervical mucus and prevents sperm from entering the uterus, and it also thins the uterine lining to prevent implantation of a fertilized egg. The hormone can weaken or kill the sperm that get into the uterus. It can stay in place for 3 5 years, depending on which type of IUD is used. PERMANENT METHODS OF CONTRACEPTION  Female tubal ligation This is when the woman's fallopian tubes are surgically sealed, tied, or blocked to prevent the egg from traveling to the uterus.  Hysteroscopic sterilization This involves placing a small coil or insert into each fallopian tube. Your doctor uses a technique called hysteroscopy to do the procedure. The device causes scar tissue to form. This results in permanent blockage of the fallopian tubes, so the sperm cannot fertilize the egg. It takes about 3 months after the procedure for the tubes to become blocked. You must use another form of birth control for these 3 months.  Female sterilization This is when the female has the tubes that carry sperm tied off (vasectomy).This blocks sperm from entering the vagina during sexual intercourse. After the procedure, the man can still ejaculate fluid (semen). NATURAL PLANNING METHODS  Natural family planning This is not having sexual intercourse or using a barrier method (condom, diaphragm, cervical cap) on days the woman could become pregnant.  Calendar method This is keeping track of the length of each menstrual cycle and identifying when you are  fertile.  Ovulation method This is avoiding sexual intercourse during ovulation.  Symptothermal method This is avoiding sexual intercourse during ovulation, using a thermometer and ovulation symptoms.  Post ovulation method This is timing sexual intercourse after you have ovulated. Regardless of which type or method of contraception you choose, it is important that you use condoms to protect against the transmission of sexually transmitted infections (STIs). Talk with your health care provider about which form of contraception is most appropriate for you. Document Released: 10/02/2005 Document Revised: 06/04/2013 Document Reviewed: 03/27/2013 [facility] Patient Information 2014 [place], [place].

## 2014-01-14 LAB — URINALYSIS W MICROSCOPIC + REFLEX CULTURE
BACTERIA UA: NONE SEEN
BILIRUBIN URINE: NEGATIVE
CASTS: NONE SEEN
CRYSTALS: NONE SEEN
GLUCOSE, UA: NEGATIVE mg/dL
HGB URINE DIPSTICK: NEGATIVE
KETONES UR: NEGATIVE mg/dL
Nitrite: NEGATIVE
PH: 5.5 (ref 5.0–8.0)
Protein, ur: NEGATIVE mg/dL
SPECIFIC GRAVITY, URINE: 1.022 (ref 1.005–1.030)
Urobilinogen, UA: 0.2 mg/dL (ref 0.0–1.0)

## 2014-01-15 LAB — URINE CULTURE: Colony Count: 9000

## 2014-03-30 ENCOUNTER — Other Ambulatory Visit: Payer: Self-pay | Admitting: Gynecology

## 2014-03-30 DIAGNOSIS — Z1231 Encounter for screening mammogram for malignant neoplasm of breast: Secondary | ICD-10-CM

## 2014-03-31 ENCOUNTER — Ambulatory Visit (HOSPITAL_COMMUNITY)
Admission: RE | Admit: 2014-03-31 | Discharge: 2014-03-31 | Disposition: A | Discharge status: Home / Self Care | Payer: BC Managed Care – PPO | Source: Ambulatory Visit | Attending: Gynecology | Admitting: Gynecology

## 2014-03-31 DIAGNOSIS — Z1231 Encounter for screening mammogram for malignant neoplasm of breast: Secondary | ICD-10-CM | POA: Insufficient documentation

## 2014-08-17 ENCOUNTER — Encounter: Payer: Self-pay | Admitting: Gynecology

## 2015-03-24 ENCOUNTER — Encounter: Payer: Self-pay | Admitting: Gynecology

## 2015-04-30 ENCOUNTER — Encounter: Payer: Self-pay | Admitting: Gynecology

## 2015-04-30 ENCOUNTER — Ambulatory Visit (INDEPENDENT_AMBULATORY_CARE_PROVIDER_SITE_OTHER): Payer: 59 | Admitting: Gynecology

## 2015-04-30 VITALS — BP 130/80 | Ht 68.0 in | Wt 244.0 lb

## 2015-04-30 DIAGNOSIS — N926 Irregular menstruation, unspecified: Secondary | ICD-10-CM

## 2015-04-30 DIAGNOSIS — Z01419 Encounter for gynecological examination (general) (routine) without abnormal findings: Secondary | ICD-10-CM | POA: Diagnosis not present

## 2015-04-30 DIAGNOSIS — N898 Other specified noninflammatory disorders of vagina: Secondary | ICD-10-CM

## 2015-04-30 DIAGNOSIS — Z01411 Encounter for gynecological examination (general) (routine) with abnormal findings: Secondary | ICD-10-CM | POA: Diagnosis present

## 2015-04-30 DIAGNOSIS — N841 Polyp of cervix uteri: Secondary | ICD-10-CM | POA: Diagnosis not present

## 2015-04-30 LAB — WET PREP FOR TRICH, YEAST, CLUE
Clue Cells Wet Prep HPF POC: NONE SEEN
TRICH WET PREP: NONE SEEN
Yeast Wet Prep HPF POC: NONE SEEN

## 2015-04-30 NOTE — Patient Instructions (Signed)
Office will call you with biopsy results.  Schedule your colonoscopy with either:  [facility]   Address: [address], [place], [place] [address]  [phone]    or  [facility]  Address: [address], [place], [place] [address]  [phone]      You may obtain a copy of any labs that were done today by logging onto MyChart as outlined in the instructions provided with your AVS (after visit summary). The office will not call with normal lab results but certainly if there are any significant abnormalities then we will contact you.   Health Maintenance, Female A healthy lifestyle and preventative care can promote health and wellness.  Maintain regular health, dental, and eye exams.  Eat a healthy diet. Foods like vegetables, fruits, whole grains, low-fat dairy products, and lean protein foods contain the nutrients you need without too many calories. Decrease your intake of foods high in solid fats, added sugars, and salt. Get information about a proper diet from your caregiver, if necessary.  Regular physical exercise is one of the most important things you can do for your health. Most adults should get at least 150 minutes of moderate-intensity exercise (any activity that increases your heart rate and causes you to sweat) each week. In addition, most adults need muscle-strengthening exercises on 2 or more days a week.   Maintain a healthy weight. The body mass index (BMI) is a screening tool to identify possible weight problems. It provides an estimate of body fat based on height and weight. Your caregiver can help determine your BMI, and can help you achieve or maintain a healthy weight. For adults 20 years and older:  A BMI below 18.5 is considered underweight.  A BMI of 18.5 to 24.9 is normal.  A BMI of 25 to 29.9 is considered overweight.  A BMI of 30 and above is considered obese.  Maintain normal blood lipids and cholesterol by  exercising and minimizing your intake of saturated fat. Eat a balanced diet with plenty of fruits and vegetables. Blood tests for lipids and cholesterol should begin at age 38 and be repeated every 5 years. If your lipid or cholesterol levels are high, you are over 50, or you are a high risk for heart disease, you may need your cholesterol levels checked more frequently.Ongoing high lipid and cholesterol levels should be treated with medicines if diet and exercise are not effective.  If you smoke, find out from your caregiver how to quit. If you do not use tobacco, do not start.  Lung cancer screening is recommended for adults aged 51 80 years who are at high risk for developing lung cancer because of a history of smoking. Yearly low-dose computed tomography (CT) is recommended for people who have at least a 30-pack-year history of smoking and are a current smoker or have quit within the past 15 years. A pack year of smoking is smoking an average of 1 pack of cigarettes a day for 1 year (for example: 1 pack a day for 30 years or 2 packs a day for 15 years). Yearly screening should continue until the smoker has stopped smoking for at least 15 years. Yearly screening should also be stopped for people who develop a health problem that would prevent them from having lung cancer treatment.  If you are pregnant, do not drink alcohol. If you are breastfeeding, be very cautious about drinking alcohol. If you are not pregnant and choose to drink alcohol, do  not exceed 1 drink per day. One drink is considered to be 12 ounces (355 mL) of beer, 5 ounces (148 mL) of wine, or 1.5 ounces (44 mL) of liquor.  Avoid use of street drugs. Do not share needles with anyone. Ask for help if you need support or instructions about stopping the use of drugs.  High blood pressure causes heart disease and increases the risk of stroke. Blood pressure should be checked at least every 1 to 2 years. Ongoing high blood pressure should be  treated with medicines, if weight loss and exercise are not effective.  If you are 20 to 50 years old, ask your caregiver if you should take aspirin to prevent strokes.  Diabetes screening involves taking a blood sample to check your fasting blood sugar level. This should be done once every 3 years, after age 7, if you are within normal weight and without risk factors for diabetes. Testing should be considered at a younger age or be carried out more frequently if you are overweight and have at least 1 risk factor for diabetes.  Breast cancer screening is essential preventative care for women. You should practice "breast self-awareness." This means understanding the normal appearance and feel of your breasts and may include breast self-examination. Any changes detected, no matter how small, should be reported to a caregiver. Women in their 67s and 30s should have a clinical breast exam (CBE) by a caregiver as part of a regular health exam every 1 to 3 years. After age 13, women should have a CBE every year. Starting at age 27, women should consider having a mammogram (breast X-ray) every year. Women who have a family history of breast cancer should talk to their caregiver about genetic screening. Women at a high risk of breast cancer should talk to their caregiver about having an MRI and a mammogram every year.  Breast cancer gene (BRCA)-related cancer risk assessment is recommended for women who have family members with BRCA-related cancers. BRCA-related cancers include breast, ovarian, tubal, and peritoneal cancers. Having family members with these cancers may be associated with an increased risk for harmful changes (mutations) in the breast cancer genes BRCA1 and BRCA2. Results of the assessment will determine the need for genetic counseling and BRCA1 and BRCA2 testing.  The Pap test is a screening test for cervical cancer. Women should have a Pap test starting at age 63. Between ages 55 and 30, Pap  tests should be repeated every 2 years. Beginning at age 28, you should have a Pap test every 3 years as long as the past 3 Pap tests have been normal. If you had a hysterectomy for a problem that was not cancer or a condition that could lead to cancer, then you no longer need Pap tests. If you are between ages 55 and 49, and you have had normal Pap tests going back 10 years, you no longer need Pap tests. If you have had past treatment for cervical cancer or a condition that could lead to cancer, you need Pap tests and screening for cancer for at least 20 years after your treatment. If Pap tests have been discontinued, risk factors (such as a new sexual partner) need to be reassessed to determine if screening should be resumed. Some women have medical problems that increase the chance of getting cervical cancer. In these cases, your caregiver may recommend more frequent screening and Pap tests.  The human papillomavirus (HPV) test is an additional test that may be used for  cervical cancer screening. The HPV test looks for the virus that can cause the cell changes on the cervix. The cells collected during the Pap test can be tested for HPV. The HPV test could be used to screen women aged 11 years and older, and should be used in women of any age who have unclear Pap test results. After the age of 52, women should have HPV testing at the same frequency as a Pap test.  Colorectal cancer can be detected and often prevented. Most routine colorectal cancer screening begins at the age of 85 and continues through age 12. However, your caregiver may recommend screening at an earlier age if you have risk factors for colon cancer. On a yearly basis, your caregiver may provide home test kits to check for hidden blood in the stool. Use of a small camera at the end of a tube, to directly examine the colon (sigmoidoscopy or colonoscopy), can detect the earliest forms of colorectal cancer. Talk to your caregiver about this at  age 43, when routine screening begins. Direct examination of the colon should be repeated every 5 to 10 years through age 73, unless early forms of pre-cancerous polyps or small growths are found.  Hepatitis C blood testing is recommended for all people born from [birth year] through 1966 and any individual with known risks for hepatitis C.  Practice safe sex. Use condoms and avoid high-risk sexual practices to reduce the spread of sexually transmitted infections (STIs). Sexually active women aged 9 and younger should be checked for Chlamydia, which is a common sexually transmitted infection. Older women with new or multiple partners should also be tested for Chlamydia. Testing for other STIs is recommended if you are sexually active and at increased risk.  Osteoporosis is a disease in which the bones lose minerals and strength with aging. This can result in serious bone fractures. The risk of osteoporosis can be identified using a bone density scan. Women ages 71 and over and women at risk for fractures or osteoporosis should discuss screening with their caregivers. Ask your caregiver whether you should be taking a calcium supplement or vitamin D to reduce the rate of osteoporosis.  Menopause can be associated with physical symptoms and risks. Hormone replacement therapy is available to decrease symptoms and risks. You should talk to your caregiver about whether hormone replacement therapy is right for you.  Use sunscreen. Apply sunscreen liberally and repeatedly throughout the day. You should seek shade when your shadow is shorter than you. Protect yourself by wearing long sleeves, pants, a wide-brimmed hat, and sunglasses year round, whenever you are outdoors.  Notify your caregiver of new moles or changes in moles, especially if there is a change in shape or color. Also notify your caregiver if a mole is larger than the size of a pencil eraser.  Stay current with your immunizations. Document Released:  04/17/2011 Document Revised: 01/27/2013 Document Reviewed: 04/17/2011 [facility] Patient Information 2014 [place].

## 2015-04-30 NOTE — Addendum Note (Signed)
Addended by: Dayna BarkerGARDNER, KIMBERLY K on: 04/30/2015 09:57 AM   Modules accepted: Orders

## 2015-04-30 NOTE — Progress Notes (Signed)
Sonia SaxBridgette D Dress [birth year] [id number]        49 y.o.  G1P1001 for annual exam.  Several issues noted below.  Past medical history,surgical history, problem list, medications, allergies, family history and social history were all reviewed and documented as reviewed in the EPIC chart.  ROS:  Performed with pertinent positives and negatives included in the history, assessment and plan.   Additional significant findings :  none   Exam: [facility] personassistant Filed Vitals:   04/30/15 0909  BP: 130/80  Height: 5\' 8"  (1.727 m)  Weight: 244 lb (110.678 kg)   General appearance:  Normal affect, orientation and appearance. Skin: Grossly normal HEENT: Without gross lesions.  No cervical or supraclavicular adenopathy. Thyroid normal.  Lungs:  Clear without wheezing, rales or rhonchi Cardiac: RR, without RMG Abdominal:  Soft, nontender, without masses, guarding, rebound, organomegaly or hernia Breasts:  Examined lying and sitting without masses, retractions, discharge or axillary adenopathy. Pelvic:  Ext/BUS/vagina normal with slight white discharge  Cervix with endocervical polyp. Pap smear done. Polyp biopsied off and sent to pathology  Uterus axial to anteverted, normal size, shape and contour, midline and mobile nontender   Adnexa  Without masses or tenderness    Anus and perineum  Normal   Rectovaginal  Normal sphincter tone without palpated masses or tenderness.    Assessment/Plan:  49 y.o. 621P1001 female for annual exam with irregular menses, condom contraception.   1. Irregular menses. Patient has skips in her menses where she'll go every other day every third month. No prolonged or atypical bleeding, no intermenstrual bleeding.  No significant hot flushes or night sweats. Check baseline FSH today. Keep menstrual calendar as long as regular menses when occur will follow. Of prolonged or atypical bleeding will call. 2. Discomfort with intercourse, irritative.  Not consistent. Exam overall is  normal with slight discharge but wet prep is negative. Plan expectant management for now and follow up if becomes more consistent issue. I think that is more related to infrequency of intercourse. 3. Endocervical polyp. Polyp was biopsied off and sent to pathology. Patient will follow up for results. 4. Contraception. I again reviewed contraceptive options with her. Using condoms consistently. Failure risks reviewed and availability of plan B discussed. Patient not interested in any other contraception at this time. 5. Pap smear 2013. Pap smear done today. No history of abnormal Pap smears previously. 6. Mammography 03/2014. Schedule mammography now. SBE monthly reviewed. 7. Colonoscopy never. Recommended scheduling a baseline colonoscopy as she has turned 49. Patient agrees to do so. 8. Health maintenance. No routine blood work done as patient has this done at her primary physician's office. Follow up for biopsy results otherwise in one year, sooner if significant irregular menses or significant menopausal symptoms.   Dara LordsFONTAINE,Haruo Stepanek P MD, 9:31 AM 04/30/2015

## 2015-05-01 LAB — URINALYSIS W MICROSCOPIC + REFLEX CULTURE
BILIRUBIN URINE: NEGATIVE
CASTS: NONE SEEN
Crystals: NONE SEEN
GLUCOSE, UA: NEGATIVE mg/dL
KETONES UR: NEGATIVE mg/dL
LEUKOCYTES UA: NEGATIVE
Nitrite: POSITIVE — AB
PH: 5.5 (ref 5.0–8.0)
Protein, ur: NEGATIVE mg/dL
Specific Gravity, Urine: 1.022 (ref 1.005–1.030)
Urobilinogen, UA: 0.2 mg/dL (ref 0.0–1.0)

## 2015-05-03 ENCOUNTER — Telehealth: Payer: Self-pay

## 2015-05-03 NOTE — Telephone Encounter (Signed)
Pathology is calling to "verify the source of a specimen".  She said on the requisition it was written two ways. One said "endocervical polyp" and one said "cervical biopsy".  She wanted to verify which one to use.  Dr. Kristie CowmanF's office note said endocervical polyp was biopsied and sent to pathology. I told her to use the endocervical polyp as source of specimen.

## 2015-05-04 ENCOUNTER — Other Ambulatory Visit: Payer: Self-pay | Admitting: Gynecology

## 2015-05-04 LAB — URINE CULTURE

## 2015-05-04 LAB — CYTOLOGY - PAP

## 2015-05-04 MED ORDER — SULFAMETHOXAZOLE-TRIMETHOPRIM 800-160 MG PO TABS: 1.0000 | ORAL_TABLET | Freq: Two times a day (BID) | ORAL | Status: DC

## 2015-05-18 ENCOUNTER — Other Ambulatory Visit: Payer: Self-pay | Admitting: Gynecology

## 2015-05-18 DIAGNOSIS — Z1231 Encounter for screening mammogram for malignant neoplasm of breast: Secondary | ICD-10-CM

## 2015-05-20 ENCOUNTER — Other Ambulatory Visit (HOSPITAL_COMMUNITY)
Admission: RE | Admit: 2015-05-20 | Discharge: 2015-05-20 | Disposition: A | Discharge status: Home / Self Care | Payer: 59 | Source: Ambulatory Visit | Attending: Gynecology | Admitting: Gynecology

## 2015-05-20 DIAGNOSIS — Z01411 Encounter for gynecological examination (general) (routine) with abnormal findings: Secondary | ICD-10-CM | POA: Insufficient documentation

## 2015-05-21 ENCOUNTER — Ambulatory Visit (HOSPITAL_COMMUNITY)
Admission: RE | Admit: 2015-05-21 | Discharge: 2015-05-21 | Disposition: A | Discharge status: Home / Self Care | Payer: 59 | Source: Ambulatory Visit | Attending: Gynecology | Admitting: Gynecology

## 2015-05-21 DIAGNOSIS — Z1231 Encounter for screening mammogram for malignant neoplasm of breast: Secondary | ICD-10-CM | POA: Diagnosis not present

## 2015-05-27 ENCOUNTER — Ambulatory Visit (HOSPITAL_COMMUNITY): Payer: Self-pay

## 2016-02-23 ENCOUNTER — Ambulatory Visit (INDEPENDENT_AMBULATORY_CARE_PROVIDER_SITE_OTHER): Payer: BLUE CROSS/BLUE SHIELD | Admitting: Gynecology

## 2016-02-23 ENCOUNTER — Encounter: Payer: Self-pay | Admitting: Gynecology

## 2016-02-23 VITALS — BP 130/76

## 2016-02-23 DIAGNOSIS — R350 Frequency of micturition: Secondary | ICD-10-CM

## 2016-02-23 DIAGNOSIS — L298 Other pruritus: Secondary | ICD-10-CM

## 2016-02-23 DIAGNOSIS — N898 Other specified noninflammatory disorders of vagina: Secondary | ICD-10-CM

## 2016-02-23 LAB — URINALYSIS W MICROSCOPIC + REFLEX CULTURE
Bilirubin Urine: NEGATIVE
CRYSTALS: NONE SEEN [HPF]
Casts: NONE SEEN [LPF]
GLUCOSE, UA: NEGATIVE
KETONES UR: NEGATIVE
Leukocytes, UA: NEGATIVE
Nitrite: NEGATIVE
PROTEIN: NEGATIVE
Specific Gravity, Urine: 1.015 (ref 1.001–1.035)
WBC, UA: NONE SEEN WBC/HPF (ref <=5)
YEAST: NONE SEEN [HPF]
pH: 7 (ref 5.0–8.0)

## 2016-02-23 LAB — WET PREP FOR TRICH, YEAST, CLUE
CLUE CELLS WET PREP: NONE SEEN
TRICH WET PREP: NONE SEEN
YEAST WET PREP: NONE SEEN

## 2016-02-23 MED ORDER — SULFAMETHOXAZOLE-TRIMETHOPRIM 800-160 MG PO TABS: 1.0000 | ORAL_TABLET | Freq: Two times a day (BID) | ORAL | Status: DC

## 2016-02-23 MED ORDER — FLUCONAZOLE 150 MG PO TABS: 150.0000 mg | ORAL_TABLET | Freq: Once | ORAL | Status: DC

## 2016-02-23 NOTE — Progress Notes (Signed)
    Carlena SaxBridgette D Trupiano 30-Oct-1965 [id number]        50 y.o.  G1P1001 Presents complaining of urinary frequency, vaginal itching and vaginal discharge. No urgency or dysuria. No low back pain, fever or chills. No vaginal odor, nausea, vomiting, diarrhea, constipation.  Past medical history,surgical history, problem list, medications, allergies, family history and social history were all reviewed and documented in the EPIC chart.  Directed ROS with pertinent positives and negatives documented in the history of present illness/assessment and plan.  Exam: Kennon PortelaKim Gardner assistant Filed Vitals:   02/23/16 1008  BP: 130/76   General appearance:  Normal  spine straight without CVA tenderness Abdomen soft nontender without masses guarding rebound Pelvic external BUS vaginascant discharge.  Cervix normal. Uterus difficult to palpate but no gross masses or tenderness. Adnexa without masses or tenderness.  Assessment/Plan:  50 y.o. G1P1001 with history and exam as above. Wet prep is negative. Symptoms certainly suggest yeast and I'm going to cover her with Diflucan 150 mg 1 dose. Having some urinary frequency with few bacteria on urinalysis. No significant leukocytes. Options for treatment versus observation with pushing fluids reviewed and follow up if symptoms persist or worsen. We'll go ahead and cover with Septra DS 1 by mouth twice a day 3 days. Follow up if symptoms persist, worsen or recur.    Dara LordsFONTAINE,Okla Qazi P MD, 10:41 AM 02/23/2016

## 2016-02-23 NOTE — Patient Instructions (Signed)
Take the antibiotic twice daily for 3 days. Take the one Diflucan pill. Follow up if your symptoms persist, worsen or recur.

## 2016-02-23 NOTE — Addendum Note (Signed)
Addended by: Dayna BarkerGARDNER, Teigen Bellin K on: 02/23/2016 11:21 AM   Modules accepted: Orders

## 2016-02-25 LAB — URINE CULTURE

## 2016-05-04 ENCOUNTER — Encounter: Payer: Self-pay | Admitting: Gynecology

## 2016-05-08 ENCOUNTER — Ambulatory Visit (INDEPENDENT_AMBULATORY_CARE_PROVIDER_SITE_OTHER): Payer: BLUE CROSS/BLUE SHIELD | Admitting: Gynecology

## 2016-05-08 ENCOUNTER — Encounter: Payer: Self-pay | Admitting: Gynecology

## 2016-05-08 VITALS — BP 124/80 | Ht 68.0 in | Wt 253.0 lb

## 2016-05-08 DIAGNOSIS — N898 Other specified noninflammatory disorders of vagina: Secondary | ICD-10-CM | POA: Diagnosis not present

## 2016-05-08 DIAGNOSIS — N926 Irregular menstruation, unspecified: Secondary | ICD-10-CM | POA: Diagnosis not present

## 2016-05-08 DIAGNOSIS — Z01419 Encounter for gynecological examination (general) (routine) without abnormal findings: Secondary | ICD-10-CM

## 2016-05-08 LAB — WET PREP FOR TRICH, YEAST, CLUE
CLUE CELLS WET PREP: NONE SEEN
TRICH WET PREP: NONE SEEN

## 2016-05-08 LAB — FOLLICLE STIMULATING HORMONE: FSH: 20 m[IU]/mL

## 2016-05-08 MED ORDER — FLUCONAZOLE 150 MG PO TABS: 150.0000 mg | ORAL_TABLET | Freq: Once | ORAL | 0 refills | Status: AC

## 2016-05-08 NOTE — Patient Instructions (Addendum)
Office will call you with the menopausal blood test result.  Take the one Diflucan pill. Save the other 2 pills to use if needed for recurrence of your vaginal itching and discharge.  Schedule your colonoscopy with either:  [facility]   Address: [address], [place], [place] [address]  [phone]    or  [facility]  Address: [address], [place], [place] [address]  [phone]    You may obtain a copy of any labs that were done today by logging onto MyChart as outlined in the instructions provided with your AVS (after visit summary). The office will not call with normal lab results but certainly if there are any significant abnormalities then we will contact you.   Health Maintenance Adopting a healthy lifestyle and getting preventive care can go a long way to promote health and wellness. Talk with your health care provider about what schedule of regular examinations is right for you. This is a good chance for you to check in with your provider about disease prevention and staying healthy. In between checkups, there are plenty of things you can do on your own. Experts have done a lot of research about which lifestyle changes and preventive measures are most likely to keep you healthy. Ask your health care provider for more information. WEIGHT AND DIET  Eat a healthy diet  Be sure to include plenty of vegetables, fruits, low-fat dairy products, and lean protein.  Do not eat a lot of foods high in solid fats, added sugars, or salt.  Get regular exercise. This is one of the most important things you can do for your health.  Most adults should exercise for at least 150 minutes each week. The exercise should increase your heart rate and make you sweat (moderate-intensity exercise).  Most adults should also do strengthening exercises at least twice a week. This is in addition to the moderate-intensity exercise.  Maintain a healthy weight  Body  mass index (BMI) is a measurement that can be used to identify possible weight problems. It estimates body fat based on height and weight. Your health care provider can help determine your BMI and help you achieve or maintain a healthy weight.  For females 21 years of age and older:   A BMI below 18.5 is considered underweight.  A BMI of 18.5 to 24.9 is normal.  A BMI of 25 to 29.9 is considered overweight.  A BMI of 30 and above is considered obese.  Watch levels of cholesterol and blood lipids  You should start having your blood tested for lipids and cholesterol at 51 years of age, then have this test every 5 years.  You may need to have your cholesterol levels checked more often if:  Your lipid or cholesterol levels are high.  You are older than 51 years of age.  You are at high risk for heart disease.  CANCER SCREENING   Lung Cancer  Lung cancer screening is recommended for adults 13-63 years old who are at high risk for lung cancer because of a history of smoking.  A yearly low-dose CT scan of the lungs is recommended for people who:  Currently smoke.  Have quit within the past 15 years.  Have at least a 30-pack-year history of smoking. A pack year is smoking an average of one pack of cigarettes a day for 1 year.  Yearly screening should continue until it has been 15 years since you quit.  Yearly screening should  stop if you develop a health problem that would prevent you from having lung cancer treatment.  Breast Cancer  Practice breast self-awareness. This means understanding how your breasts normally appear and feel.  It also means doing regular breast self-exams. Let your health care provider know about any changes, no matter how small.  If you are in your 20s or 30s, you should have a clinical breast exam (CBE) by a health care provider every 1-3 years as part of a regular health exam.  If you are 1 or older, have a CBE every year. Also consider having a  breast X-ray (mammogram) every year.  If you have a family history of breast cancer, talk to your health care provider about genetic screening.  If you are at high risk for breast cancer, talk to your health care provider about having an MRI and a mammogram every year.  Breast cancer gene (BRCA) assessment is recommended for women who have family members with BRCA-related cancers. BRCA-related cancers include:  Breast.  Ovarian.  Tubal.  Peritoneal cancers.  Results of the assessment will determine the need for genetic counseling and BRCA1 and BRCA2 testing. Cervical Cancer Routine pelvic examinations to screen for cervical cancer are no longer recommended for nonpregnant women who are considered low risk for cancer of the pelvic organs (ovaries, uterus, and vagina) and who do not have symptoms. A pelvic examination may be necessary if you have symptoms including those associated with pelvic infections. Ask your health care provider if a screening pelvic exam is right for you.   The Pap test is the screening test for cervical cancer for women who are considered at risk.  If you had a hysterectomy for a problem that was not cancer or a condition that could lead to cancer, then you no longer need Pap tests.  If you are older than 65 years, and you have had normal Pap tests for the past 10 years, you no longer need to have Pap tests.  If you have had past treatment for cervical cancer or a condition that could lead to cancer, you need Pap tests and screening for cancer for at least 20 years after your treatment.  If you no longer get a Pap test, assess your risk factors if they change (such as having a new sexual partner). This can affect whether you should start being screened again.  Some women have medical problems that increase their chance of getting cervical cancer. If this is the case for you, your health care provider may recommend more frequent screening and Pap tests.  The  human papillomavirus (HPV) test is another test that may be used for cervical cancer screening. The HPV test looks for the virus that can cause cell changes in the cervix. The cells collected during the Pap test can be tested for HPV.  The HPV test can be used to screen women 35 years of age and older. Getting tested for HPV can extend the interval between normal Pap tests from three to five years.  An HPV test also should be used to screen women of any age who have unclear Pap test results.  After 51 years of age, women should have HPV testing as often as Pap tests.  Colorectal Cancer  This type of cancer can be detected and often prevented.  Routine colorectal cancer screening usually begins at 51 years of age and continues through 51 years of age.  Your health care provider may recommend screening at an earlier age  if you have risk factors for colon cancer.  Your health care provider may also recommend using home test kits to check for hidden blood in the stool.  A small camera at the end of a tube can be used to examine your colon directly (sigmoidoscopy or colonoscopy). This is done to check for the earliest forms of colorectal cancer.  Routine screening usually begins at age 42.  Direct examination of the colon should be repeated every 5-10 years through 51 years of age. However, you may need to be screened more often if early forms of precancerous polyps or small growths are found. Skin Cancer  Check your skin from head to toe regularly.  Tell your health care provider about any new moles or changes in moles, especially if there is a change in a mole's shape or color.  Also tell your health care provider if you have a mole that is larger than the size of a pencil eraser.  Always use sunscreen. Apply sunscreen liberally and repeatedly throughout the day.  Protect yourself by wearing long sleeves, pants, a wide-brimmed hat, and sunglasses whenever you are outside. HEART  DISEASE, DIABETES, AND HIGH BLOOD PRESSURE   Have your blood pressure checked at least every 1-2 years. High blood pressure causes heart disease and increases the risk of stroke.  If you are between 74 years and 40 years old, ask your health care provider if you should take aspirin to prevent strokes.  Have regular diabetes screenings. This involves taking a blood sample to check your fasting blood sugar level.  If you are at a normal weight and have a low risk for diabetes, have this test once every three years after 51 years of age.  If you are overweight and have a high risk for diabetes, consider being tested at a younger age or more often. PREVENTING INFECTION  Hepatitis B  If you have a higher risk for hepatitis B, you should be screened for this virus. You are considered at high risk for hepatitis B if:  You were born in a country where hepatitis B is common. Ask your health care provider which countries are considered high risk.  Your parents were born in a high-risk country, and you have not been immunized against hepatitis B (hepatitis B vaccine).  You have HIV or AIDS.  You use needles to inject street drugs.  You live with someone who has hepatitis B.  You have had sex with someone who has hepatitis B.  You get hemodialysis treatment.  You take certain medicines for conditions, including cancer, organ transplantation, and autoimmune conditions. Hepatitis C  Blood testing is recommended for:  Everyone born from [birth year] through 1966.  Anyone with known risk factors for hepatitis C. Sexually transmitted infections (STIs)  You should be screened for sexually transmitted infections (STIs) including gonorrhea and chlamydia if:  You are sexually active and are younger than 51 years of age.  You are older than 51 years of age and your health care provider tells you that you are at risk for this type of infection.  Your sexual activity has changed since you were last  screened and you are at an increased risk for chlamydia or gonorrhea. Ask your health care provider if you are at risk.  If you do not have HIV, but are at risk, it may be recommended that you take a prescription medicine daily to prevent HIV infection. This is called pre-exposure prophylaxis (PrEP). You are considered at risk if:  You are sexually active and do not regularly use condoms or know the HIV status of your partner(s).  You take drugs by injection.  You are sexually active with a partner who has HIV. Talk with your health care provider about whether you are at high risk of being infected with HIV. If you choose to begin PrEP, you should first be tested for HIV. You should then be tested every 3 months for as long as you are taking PrEP.  PREGNANCY   If you are premenopausal and you may become pregnant, ask your health care provider about preconception counseling.  If you may become pregnant, take 400 to 800 micrograms (mcg) of folic acid every day.  If you want to prevent pregnancy, talk to your health care provider about birth control (contraception). OSTEOPOROSIS AND MENOPAUSE   Osteoporosis is a disease in which the bones lose minerals and strength with aging. This can result in serious bone fractures. Your risk for osteoporosis can be identified using a bone density scan.  If you are 76 years of age or older, or if you are at risk for osteoporosis and fractures, ask your health care provider if you should be screened.  Ask your health care provider whether you should take a calcium or vitamin D supplement to lower your risk for osteoporosis.  Menopause may have certain physical symptoms and risks.  Hormone replacement therapy may reduce some of these symptoms and risks. Talk to your health care provider about whether hormone replacement therapy is right for you.  HOME CARE INSTRUCTIONS   Schedule regular health, dental, and eye exams.  Stay current with your  immunizations.   Do not use any tobacco products including cigarettes, chewing tobacco, or electronic cigarettes.  If you are pregnant, do not drink alcohol.  If you are breastfeeding, limit how much and how often you drink alcohol.  Limit alcohol intake to no more than 1 drink per day for nonpregnant women. One drink equals 12 ounces of beer, 5 ounces of wine, or 1 ounces of hard liquor.  Do not use street drugs.  Do not share needles.  Ask your health care provider for help if you need support or information about quitting drugs.  Tell your health care provider if you often feel depressed.  Tell your health care provider if you have ever been abused or do not feel safe at home. Document Released: 04/17/2011 Document Revised: 02/16/2014 Document Reviewed: 09/03/2013 [facility] Patient Information 2015 [place], [place]. This information is not intended to replace advice given to you by your health care provider. Make sure you discuss any questions you have with your health care provider.

## 2016-05-08 NOTE — Progress Notes (Addendum)
    Sonia Murphy Jul 02, 1966 [id number]        50 y.o.  G1P1001  for annual exam.  Also complaining of some vaginal itching with irritation and irregular menses as discussed below.  Past medical history,surgical history, problem list, medications, allergies, family history and social history were all reviewed and documented as reviewed in the EPIC chart.  ROS:  Performed with pertinent positives and negatives included in the history, assessment and plan.   Additional significant findings :  None   Exam: Kennon Portela assistant Vitals:   05/08/16 0942  BP: 124/80  Weight: 253 lb (114.8 kg)  Height: 5\' 8"  (1.727 m)   General appearance:  Normal affect, orientation and appearance. Skin: Grossly normal HEENT: Without gross lesions.  No cervical or supraclavicular adenopathy. Thyroid normal.  Lungs:  Clear without wheezing, rales or rhonchi Cardiac: RR, without RMG Abdominal:  Soft, nontender, without masses, guarding, rebound, organomegaly or hernia Breasts:  Examined lying and sitting without masses, retractions, discharge or axillary adenopathy. Pelvic:  Ext/BUS/Vagina with white discharge  Cervix normal  Uterus anteverted, normal size, shape and contour, midline and mobile nontender   Adnexa without masses or tenderness    Anus and perineum normal   Rectovaginal normal sphincter tone without palpated masses or tenderness.    Assessment/Plan:  50 y.o. G93P1001 female for annual exam with irregular menses, condoms contraception.   1. Irregular menses. Patient notes her last menstrual periods was in March. No bleeding since. They were starting to space out before this. No prolonged or atypical bleeding. No significant hot flushes or night sweats. Will check baseline FSH today. Discussed perimenopausal changes versus anovulatory. If FSH elevated will keep menstrual calendar as long as no prolonged or atypical bleeding them will monitor. If she does develop abnormal bleeding or she  goes more than 1 year without a menstrual period and then has bleeding she knows to report this. If FSH is normal I discussed the progesterone withdrawal to prevent hyperplasia. Patient will follow up for her blood test results in several days. 2. Vaginal irritation. Discharge and wet prep consistent with yeast. Diflucan 150 mg 1 dose prescribed. 2 extra pills prescribed to have available the to use as needed. 3. Contraception. Patient knows the need to continue with condoms until she is at least one year from bleeding. 4. Mammogram coming due next month and patient knows to call and schedule. SBE monthly reviewed. 5. Colonoscopy never. Patient plans to schedule this year. Names and numbers provided for her to call. Importance of follow up discussed. 6. Pap smear 2016 normal. No Pap smear done today. No history of significant abnormal Pap smears. Plan repeat Pap smear at 3 year interval per current screening guidelines. 7. Health maintenance. No routine lab work ordered as patient reports this done elsewhere. Follow up for [facility] results. Follow up if vaginal discharge or irritation continues. Follow up in one year for annual exam.   Greater than 10 minutes of my time in excess of her annual GYN exam was spent in direct face to face counseling and coordination of care in regards to her problems of vaginal discharge/itching and irregular menses.  Dara Lords MD, 10:08 AM 05/08/2016

## 2016-05-15 ENCOUNTER — Other Ambulatory Visit: Payer: Self-pay | Admitting: *Deleted

## 2016-05-15 MED ORDER — MEDROXYPROGESTERONE ACETATE 10 MG PO TABS: ORAL_TABLET | ORAL | 5 refills | Status: DC

## 2016-05-26 ENCOUNTER — Other Ambulatory Visit: Payer: Self-pay | Admitting: Gynecology

## 2016-05-26 ENCOUNTER — Encounter: Payer: Self-pay | Admitting: Internal Medicine

## 2016-05-26 DIAGNOSIS — Z1231 Encounter for screening mammogram for malignant neoplasm of breast: Secondary | ICD-10-CM

## 2016-06-06 ENCOUNTER — Ambulatory Visit: Payer: Self-pay

## 2016-06-13 ENCOUNTER — Ambulatory Visit
Admission: RE | Admit: 2016-06-13 | Discharge: 2016-06-13 | Disposition: A | Discharge status: Home / Self Care | Payer: BLUE CROSS/BLUE SHIELD | Source: Ambulatory Visit | Attending: Gynecology | Admitting: Gynecology

## 2016-06-13 DIAGNOSIS — Z1231 Encounter for screening mammogram for malignant neoplasm of breast: Secondary | ICD-10-CM

## 2016-07-13 ENCOUNTER — Ambulatory Visit (AMBULATORY_SURGERY_CENTER): Payer: Self-pay | Admitting: *Deleted

## 2016-07-13 ENCOUNTER — Encounter: Payer: Self-pay | Admitting: Internal Medicine

## 2016-07-13 VITALS — Ht 68.0 in | Wt 250.2 lb

## 2016-07-13 DIAGNOSIS — Z1211 Encounter for screening for malignant neoplasm of colon: Secondary | ICD-10-CM

## 2016-07-13 MED ORDER — NA SULFATE-K SULFATE-MG SULF 17.5-3.13-1.6 GM/177ML PO SOLN: ORAL | 0 refills | Status: DC

## 2016-07-13 NOTE — Progress Notes (Signed)
No egg or soy allergy  No home oxygen used or diet medications taken  No intubation problems  Does have some nausea after anesthesia

## 2016-07-27 ENCOUNTER — Ambulatory Visit (AMBULATORY_SURGERY_CENTER): Payer: BLUE CROSS/BLUE SHIELD | Admitting: Internal Medicine

## 2016-07-27 ENCOUNTER — Encounter: Payer: Self-pay | Admitting: Internal Medicine

## 2016-07-27 VITALS — BP 126/69 | HR 75 | Temp 96.9°F | Resp 19 | Ht 68.0 in | Wt 250.0 lb

## 2016-07-27 DIAGNOSIS — Z1211 Encounter for screening for malignant neoplasm of colon: Secondary | ICD-10-CM | POA: Diagnosis not present

## 2016-07-27 DIAGNOSIS — K5732 Diverticulitis of large intestine without perforation or abscess without bleeding: Secondary | ICD-10-CM

## 2016-07-27 DIAGNOSIS — K5730 Diverticulosis of large intestine without perforation or abscess without bleeding: Secondary | ICD-10-CM

## 2016-07-27 DIAGNOSIS — Z1212 Encounter for screening for malignant neoplasm of rectum: Secondary | ICD-10-CM

## 2016-07-27 MED ORDER — CIPROFLOXACIN HCL 500 MG PO TABS: 500.0000 mg | ORAL_TABLET | Freq: Two times a day (BID) | ORAL | 0 refills | Status: DC

## 2016-07-27 MED ORDER — SODIUM CHLORIDE 0.9 % IV SOLN: 500.0000 mL | INTRAVENOUS | Status: DC

## 2016-07-27 NOTE — Progress Notes (Signed)
To PACU, vss patent aw report to rn 

## 2016-07-27 NOTE — Progress Notes (Signed)
Pt was given dicyclomine by her primary care physician for recent bout of diverticulitis.  She stopped it due to cramping.

## 2016-07-27 NOTE — Op Note (Signed)
[facility] Patient Name: Sonia HazelBridgette Waldeck Procedure Date: 07/27/2016 11:20 AM MRN: [id number] Endoscopist: Wilhemina BonitoJohn N. Marina GoodellPerry , MD Age: 89 Referring MD:  Date of Birth: 1966-09-20 Gender: Female Account #: [id number] Procedure:                Colonoscopy Indications:              Screening for colorectal malignant neoplasm. NOTE:                            The patient presented to the endoscopy suite as a                            direct referral for screening colonoscopy. She was                            prepped. Upon arrival she mentioned that she had an                            attack of diverticulitis earlier this week. Saw her                            PCP but was not placed on antibiotics. No                            significant abdominal pain and benign abdomen on                            exam. Recently documented diverticulitis in the                            sigmoid region by CT February 2013. Elected to                            proceed with today's exam Medicines:                Monitored Anesthesia Care Procedure:                Pre-Anesthesia Assessment:                           - Prior to the procedure, a History and Physical                            was performed, and patient medications and                            allergies were reviewed. The patient's tolerance of                            previous anesthesia was also reviewed. The risks                            and benefits of the procedure and the sedation  options and risks were discussed with the patient.                            All questions were answered, and informed consent                            was obtained. Prior Anticoagulants: The patient has                            taken no previous anticoagulant or antiplatelet                            agents. ASA Grade Assessment: II - A patient with                            mild systemic disease.  After reviewing the risks                            and benefits, the patient was deemed in                            satisfactory condition to undergo the procedure.                           After obtaining informed consent, the colonoscope                            was passed under direct vision. Throughout the                            procedure, the patient's blood pressure, pulse, and                            oxygen saturations were monitored continuously. The                            Model CF-HQ190L [id number]) scope was introduced                            through the anus and advanced to the the cecum,                            identified by appendiceal orifice and ileocecal                            valve. The ileocecal valve, appendiceal orifice,                            and rectum were photographed. The quality of the                            bowel preparation was excellent. The colonoscopy  was performed without difficulty. The patient                            tolerated the procedure well. The bowel preparation                            used was SUPREP. Scope In: 11:30:52 AM Scope Out: 11:44:23 AM Scope Withdrawal Time: 0 hours 7 minutes 22 seconds  Total Procedure Duration: 0 hours 13 minutes 31 seconds  Findings:                 Multiple small and large-mouthed diverticula were                            found in the entire colon.                           Localized moderate inflammation characterized by                            congestion (edema) and erythema was found in the                            sigmoid colon. There was moderate luminal                            narrowing. Changes consistent with diverticulitis.                           The exam was otherwise without abnormality on                            direct and retroflexion views. Complications:            No immediate complications. Estimated blood loss:                             None. Estimated Blood Loss:     Estimated blood loss: none. Impression:               - Diverticulosis in the entire examined colon.                           - Localized moderate inflammation was found in the                            sigmoid colon consistent with acute diverticulitis.                           - The examination was otherwise normal on direct                            and retroflexion views.                           - No specimens collected. Recommendation:           -  Repeat colonoscopy in 10 years for screening                            purposes.                           - Prescribe ciprofloxacin 500 mg twice a day; #14;                            complete 1 week course.                           - Low residue diet.                           - GI office follow-up with Dr. Marina Goodell or GI advanced                            practitioner in about 2 weeks. Wilhemina Bonito. Marina Goodell, MD 07/27/2016 11:59:24 AM This report has been signed electronically. CC Letter to:             Colin Broach, MD, Burton Apley

## 2016-07-27 NOTE — Patient Instructions (Signed)
YOU HAD AN ENDOSCOPIC PROCEDURE TODAY AT THE [facility]:   Refer to the procedure report that was given to you for any specific questions about what was found during the examination.  If the procedure report does not answer your questions, please call your gastroenterologist to clarify.  If you requested that your care partner not be given the details of your procedure findings, then the procedure report has been included in a sealed envelope for you to review at your convenience later.  YOU SHOULD EXPECT: Some feelings of bloating in the abdomen. Passage of more gas than usual.  Walking can help get rid of the air that was put into your GI tract during the procedure and reduce the bloating. If you had a lower endoscopy (such as a colonoscopy or flexible sigmoidoscopy) you may notice spotting of blood in your stool or on the toilet paper. If you underwent a bowel prep for your procedure, you may not have a normal bowel movement for a few days.  Please Note:  You might notice some irritation and congestion in your nose or some drainage.  This is from the oxygen used during your procedure.  There is no need for concern and it should clear up in a day or so.  SYMPTOMS TO REPORT IMMEDIATELY:   Following lower endoscopy (colonoscopy or flexible sigmoidoscopy):  Excessive amounts of blood in the stool  Significant tenderness or worsening of abdominal pains  Swelling of the abdomen that is new, acute  Fever of 100F or higher    For urgent or emergent issues, a gastroenterologist can be reached at any hour by calling [phone].   DIET:  We do recommend a small meal at first, but then you may proceed to your regular diet.  Drink plenty of fluids but you should avoid alcoholic beverages for 24 hours.  ACTIVITY:  You should plan to take it easy for the rest of today and you should NOT DRIVE or use heavy machinery until tomorrow (because of the sedation medicines used during the test).     FOLLOW UP: Our staff will call the number listed on your records the next business day following your procedure to check on you and address any questions or concerns that you may have regarding the information given to you following your procedure. If we do not reach you, we will leave a message.  However, if you are feeling well and you are not experiencing any problems, there is no need to return our call.  We will assume that you have returned to your regular daily activities without incident.  If any biopsies were taken you will be contacted by phone or by letter within the next 1-3 weeks.  Please call us at [phone] if you have not heard about the biopsies in 3 weeks.    SIGNATURES/CONFIDENTIALITY: You and/or your care partner have signed paperwork which will be entered into your electronic medical record.  These signatures attest to the fact that that the information above on your After Visit Summary has been reviewed and is understood.  Full responsibility of the confidentiality of this discharge information lies with you and/or your care-partner.  Repeat colonoscopy in 10 years 2027.  Low residue diet.  Follow-up with Dr Marina GoodellPerry or GI advanced practitioner in 2 weeks, office will call with this appointment.

## 2016-07-28 ENCOUNTER — Telehealth: Payer: Self-pay

## 2016-07-28 NOTE — Telephone Encounter (Signed)
  Follow up Call-  Call back number 07/27/2016  Post procedure Call Back phone  # [phone]  Permission to leave phone message Yes  Some recent data might be hidden     Patient questions:  Do you have a fever, pain , or abdominal swelling? No. Pain Score  0 *  Have you tolerated food without any problems? Yes.    Have you been able to return to your normal activities? Yes.    Do you have any questions about your discharge instructions: Diet   No. Medications  No. Follow up visit  No.  Do you have questions or concerns about your Care? No.  Actions: * If pain score is 4 or above: No action needed, pain <4.

## 2016-08-15 ENCOUNTER — Ambulatory Visit (INDEPENDENT_AMBULATORY_CARE_PROVIDER_SITE_OTHER): Payer: BLUE CROSS/BLUE SHIELD | Admitting: Physician Assistant

## 2016-08-15 ENCOUNTER — Encounter: Payer: Self-pay | Admitting: Physician Assistant

## 2016-08-15 DIAGNOSIS — K5732 Diverticulitis of large intestine without perforation or abscess without bleeding: Secondary | ICD-10-CM | POA: Diagnosis not present

## 2016-08-15 NOTE — Progress Notes (Signed)
Subjective:    Patient ID: Sonia Murphy, female    DOB: 14-Oct-1966, 50 y.o.   MRN: [id number]  HPI Sonia Murphy is a pleasant 50 year old African-American female recently known to Sonia Murphy who comes in today for follow-up post colonoscopy. Patient was referred for direct colonoscopy by Sonia Murphy and had exam done on 07/27/2016. She was found to have multiple small and large mouth diverticuli throughout the entire colon. There was evidence of sigmoid diverticulitis with edema and erythema and associated moderate luminal narrowing.. Patient had no polyps. She was treated with a seven-day course of Cipro 500 mg by mouth twice a day. Patient states that she had been treated for diverticulitis in the past and did have a CT scan done in 2013 that documented sigmoid diverticulitis. She's feels that she may have had some mild episodes in the interim that have not been treated. After taking the course of Cipro her abdominal pain has resolved and she feels good today. She says she generally doesn't have any problems with constipation. Multiple questions discussed and answered regarding diverticular disease and management.  Review of Systems Pertinent positive and negative review of systems were noted in the above HPI section.  All other review of systems was otherwise negative.  Outpatient Encounter Prescriptions as of 08/15/2016  Medication Sig  . lisinopril (PRINIVIL,ZESTRIL) 5 MG tablet Take 20 mg by mouth daily.   . medroxyPROGESTERone (PROVERA) 10 MG tablet Take one tablet by mouth daily for 12 days, Repeat every 2 months if without a spontaneous period for the next year. (Patient not taking: Reported on 08/15/2016)  . [DISCONTINUED] ciprofloxacin (CIPRO) 500 MG tablet Take 1 tablet (500 mg total) by mouth 2 (two) times daily. (Patient not taking: Reported on 08/15/2016)  . [DISCONTINUED] omeprazole (PRILOSEC) 20 MG capsule    Facility-Administered Encounter Medications as of 08/15/2016    Medication  . 0.9 %  sodium chloride infusion   Allergies  Allergen Reactions  . Penicillins Hives   Patient Active Problem List   Diagnosis Date Noted  . Upper respiratory infection    Social History   Social History  . Marital status: Single    Spouse name: N/A  . Number of children: N/A  . Years of education: N/A   Occupational History  . Not on file.   Social History Main Topics  . Smoking status: Former Smoker    Quit date: 12/21/2010  . Smokeless tobacco: Never Used  . Alcohol use 0.0 oz/week     Comment: rarely  . Drug use: No  . Sexual activity: Yes    Birth control/ protection: Condom     Comment: 1st intercourse 19 yo-5 partners   Other Topics Concern  . Not on file   Social History Narrative  . No narrative on file    Ms. Bottoms's family history includes Breast cancer (age of onset: 4275) in her mother; Diabetes in her brother and mother; Hypertension in her father and mother.      Objective:    Vitals:   08/15/16 0909  BP: 128/86  Pulse: 72    Physical Exam  well-developed African-American female in no acute distress, pleasant blood pressure 128/86 pulse 72, BMI 37.5. HEENT; nontraumatic normocephalic EOMI PERRLA sclera anicteric, Cardiovascular ;regular rate and rhythm with S1-S2 no murmur or gallop, Pulmonary ;clear bilaterally, Abdomen ;soft nontender nondistended bowel sounds are active there is no palpable mass or hepatosplenomegaly incisional scar, Rectal; exam not done, Neuropsych; mood and affect appropriate  Assessment & Plan:   #521 50 year old female status post screening colonoscopy on 07/27/2016 with finding of multiple small and large mouth diverticuli throughout the entire colon, and evidence of sigmoid diverticulitis. Patient is asymptomatic today after a seven-day course of Cipro.  Patient feels that she has had other episodes of diverticulitis and had one episode documented of sigmoid diverticulitis on CT scan  2013.  Plan; Long discussion today regarding diverticulosis, diverticulitis and management. She'll start Benefiber 2 tablespoons daily in large glass of water, continue prune juice as needed We discussed high-fiber diet and avoidance of popcorn and nuts She is advised to call here for advice should she have any recurrent episodes of abdominal pain/diverticulitis. Follow-up colonoscopy anticipated at a 10 year interval..        Sonia Coopermy S Johney Perotti PA-C 08/15/2016   Cc: Sonia Murphy, Ronald, MD

## 2016-08-15 NOTE — Progress Notes (Signed)
Agree with assessment and plans as outlined 

## 2016-08-15 NOTE — Patient Instructions (Signed)
Take Benefiber ,  2 Tablespoons in glass of water daily.  Follow up with Dr. Yancey FlemingsJohn Perry or [facility] as needed.   Information on Diverticulosis, Diverticulitis and High Fiber Diet provided.

## 2017-01-17 ENCOUNTER — Encounter (HOSPITAL_COMMUNITY): Payer: Self-pay | Admitting: Family Medicine

## 2017-01-17 ENCOUNTER — Ambulatory Visit (HOSPITAL_COMMUNITY)
Admission: EM | Admit: 2017-01-17 | Discharge: 2017-01-17 | Disposition: A | Discharge status: Home / Self Care | Payer: BLUE CROSS/BLUE SHIELD | Attending: Internal Medicine | Admitting: Internal Medicine

## 2017-01-17 DIAGNOSIS — B9789 Other viral agents as the cause of diseases classified elsewhere: Secondary | ICD-10-CM

## 2017-01-17 DIAGNOSIS — J069 Acute upper respiratory infection, unspecified: Secondary | ICD-10-CM | POA: Diagnosis not present

## 2017-01-17 MED ORDER — GUAIFENESIN 100 MG/5ML PO LIQD: 400.0000 mg | Freq: Four times a day (QID) | ORAL | 0 refills | Status: DC | PRN

## 2017-01-17 MED ORDER — FLUTICASONE PROPIONATE 50 MCG/ACT NA SUSP: 2.0000 | Freq: Every day | NASAL | 0 refills | Status: DC

## 2017-01-17 MED ORDER — BENZONATATE 100 MG PO CAPS: 100.0000 mg | ORAL_CAPSULE | Freq: Three times a day (TID) | ORAL | 0 refills | Status: DC

## 2017-01-17 NOTE — ED Provider Notes (Signed)
CSN: [id number]     Arrival date & time 01/17/17  1921 History   First MD Initiated Contact with Patient 01/17/17 2017     Chief Complaint  Patient presents with  . Otalgia  . Cough   (Consider location/radiation/quality/duration/timing/severity/associated sxs/prior Treatment) HPI Sonia Murphy is a 51 y.o. female presenting to UC with c/o 4 days of URI symptoms including cough, congestion, Right ear pain, and body aches.  Rhinorrhea. She has used nasal saline and taken Zyrtec. She also took tylenol once and ibuprofen once with minimal relief.  No medication taken today.  No known sick contacts. Denies recent travel. No hx of asthma.    Past Medical History:  Diagnosis Date  . Allergy   . Anemia    with pregnancy  . Diverticulosis    diverticulitis- 2014,tx with oral antibiotics  . Hypertension    Past Surgical History:  Procedure Laterality Date  . CESAREAN SECTION    . FOOT SURGERY     Family History  Problem Relation Age of Onset  . Hypertension Mother   . Diabetes Mother   . Breast cancer Mother 60  . Hypertension Father   . Diabetes Brother   . Colon cancer Neg Hx   . Esophageal cancer Neg Hx   . Rectal cancer Neg Hx   . Stomach cancer Neg Hx    Social History  Substance Use Topics  . Smoking status: Former Smoker    Quit date: 12/21/2010  . Smokeless tobacco: Never Used  . Alcohol use 0.0 oz/week     Comment: rarely   OB History    Gravida Para Term Preterm AB Living   SAB TAB Ectopic Multiple Live Births                 Review of Systems  Constitutional: Negative for chills and fever.  HENT: Positive for congestion, ear pain (Right), postnasal drip, rhinorrhea and sore throat. Negative for trouble swallowing and voice change.   Respiratory: Positive for cough. Negative for shortness of breath.   Cardiovascular: Negative for chest pain and palpitations.  Gastrointestinal: Negative for abdominal pain, diarrhea, nausea and vomiting.   Musculoskeletal: Positive for arthralgias, back pain and myalgias.  Skin: Negative for rash.    Allergies  Penicillins  Home Medications   Prior to Admission medications   Medication Sig Start Date End Date Taking? Authorizing Provider  benzonatate (TESSALON) 100 MG capsule Take 1 capsule (100 mg total) by mouth every 8 (eight) hours. 01/17/17   Junius Finner, PA-C  fluticasone (FLONASE) 50 MCG/ACT nasal spray Place 2 sprays into both nostrils daily. 01/17/17   Junius Finner, PA-C  guaiFENesin (ROBITUSSIN) 100 MG/5ML liquid Take 20 mLs (400 mg total) by mouth 4 (four) times daily as needed for cough or congestion. 01/17/17   Junius Finner, PA-C  lisinopril (PRINIVIL,ZESTRIL) 5 MG tablet Take 20 mg by mouth daily.     Historical Provider, MD  medroxyPROGESTERone (PROVERA) 10 MG tablet Take one tablet by mouth daily for 12 days, Repeat every 2 months if without a spontaneous period for the next year. Patient not taking: Reported on 08/15/2016 05/15/16   Dara Lords, MD   Meds Ordered and Administered this Visit  Medications - No data to display  BP (!) 143/93   Pulse 81   Temp 98 F (36.7 C)   Resp 18   SpO2 98%  No data found.   Physical Exam  Constitutional: She is oriented to person, place, and time. She appears well-developed and well-nourished. No distress.  HENT:  Head: Normocephalic and atraumatic.  Right Ear: Tympanic membrane normal.  Left Ear: Tympanic membrane normal.  Nose: Mucosal edema present. Right sinus exhibits no maxillary sinus tenderness and no frontal sinus tenderness. Left sinus exhibits no maxillary sinus tenderness and no frontal sinus tenderness.  Mouth/Throat: Uvula is midline, oropharynx is clear and moist and mucous membranes are normal.  Eyes: EOM are normal.  Neck: Normal range of motion. Neck supple.  Cardiovascular: Normal rate and regular rhythm.   Pulmonary/Chest: Effort normal and breath sounds normal. No stridor. No respiratory distress.  She has no wheezes. She has no rales.  Musculoskeletal: Normal range of motion.  Lymphadenopathy:    She has no cervical adenopathy.  Neurological: She is alert and oriented to person, place, and time.  Skin: Skin is warm and dry. She is not diaphoretic.  Psychiatric: She has a normal mood and affect. Her behavior is normal.  Nursing note and vitals reviewed.   Urgent Care Course     Procedures (including critical care time)  Labs Review Labs Reviewed - No data to display  Imaging Review No results found.    MDM   1. Viral URI with cough    Hx and exam c/w viral URI. No evidence of underlying infection at this time.  Encouraged symptomatic treatment.  Rx: Robitussin, Flonase, and Tessalon  Encouraged acetaminophen and ibuprofen, fluids, and rest. f/u with PCP in 1 week if not improving, sooner if worsening.    Junius Finner, PA-C 01/17/17 2030

## 2017-01-17 NOTE — Discharge Instructions (Signed)
You may take 400-600mg  Ibuprofen (Motrin) every 6-8 hours for fever and pain  Alternate with Tylenol  You may take  Tylenol every 4-6 hours as needed for fever and pain  Follow-up with your primary care provider next week for recheck of symptoms if not improving.  Be sure to drink plenty of fluids and rest, at least 8hrs of sleep a night, preferably more while you are sick. Return urgent care or go to closest ER if you cannot keep down fluids/signs of dehydration, fever not reducing with Tylenol, difficulty breathing/wheezing, stiff neck, worsening condition, or other concerns (see below)   Your symptoms are likely due to a virus such as the common cold.  Antibiotics will not help a viral infection.  If you developing worsening chest congestion with shortness of breath, persistent fever (>100.8*F) for 3 days, or symptoms not improving in 7 days, it is recommended you follow up with your primary care provider for recheck of symptoms as you may develop a secondary bacterial infection.

## 2017-01-17 NOTE — ED Triage Notes (Signed)
Pt here for cough, back pain and ear pain. sts achy feeling all over.

## 2017-05-20 ENCOUNTER — Other Ambulatory Visit: Payer: Self-pay | Admitting: Gynecology

## 2017-05-31 ENCOUNTER — Ambulatory Visit (INDEPENDENT_AMBULATORY_CARE_PROVIDER_SITE_OTHER): Payer: BLUE CROSS/BLUE SHIELD | Admitting: Gynecology

## 2017-05-31 ENCOUNTER — Encounter: Payer: Self-pay | Admitting: Gynecology

## 2017-05-31 VITALS — BP 122/80 | Ht 67.5 in | Wt 245.0 lb

## 2017-05-31 DIAGNOSIS — Z01411 Encounter for gynecological examination (general) (routine) with abnormal findings: Secondary | ICD-10-CM

## 2017-05-31 DIAGNOSIS — N898 Other specified noninflammatory disorders of vagina: Secondary | ICD-10-CM | POA: Diagnosis not present

## 2017-05-31 DIAGNOSIS — N926 Irregular menstruation, unspecified: Secondary | ICD-10-CM

## 2017-05-31 LAB — WET PREP FOR TRICH, YEAST, CLUE
CLUE CELLS WET PREP: NONE SEEN
TRICH WET PREP: NONE SEEN

## 2017-05-31 MED ORDER — FLUCONAZOLE 150 MG PO TABS: 150.0000 mg | ORAL_TABLET | Freq: Once | ORAL | 0 refills | Status: AC

## 2017-05-31 NOTE — Patient Instructions (Addendum)
Take the one Diflucan pill now for the yeast infection. Use the remaining pills as needed for symptoms when they develop.  Call to Schedule your mammogram  [facility], [address] Phone: [phone]     Mammogram A mammogram is an X-ray test to find changes in a woman's breast. You should get a mammogram if:  You are 52 years of age or older  You have risk factors.   Your doctor recommends that you have one.  BEFORE THE TEST  Do not schedule the test the week before your period, especially if your breasts are sore during this time.  On the day of your mammogram:  Wash your breasts and armpits well. After washing, do not put on any deodorant or talcum powder on until after your test.   Eat and drink as you usually do.   Take your medicines as usual.   If you are diabetic and take insulin, make sure you:   Eat before coming for your test.   Take your insulin as usual.   If you cannot keep your appointment, call before the appointment to cancel. Schedule another appointment.  TEST  You will need to undress from the waist up. You will put on a hospital gown.   Your breast will be put on the mammogram machine, and it will press firmly on your breast with a piece of plastic called a compression paddle. This will make your breast flatter so that the machine can X-ray all parts of your breast.   Both breasts will be X-rayed. Each breast will be X-rayed from above and from the side. An X-ray might need to be taken again if the picture is not good enough.   The mammogram will last about 15 to 30 minutes.  AFTER THE TEST Finding out the results of your test Ask when your test results will be ready. Make sure you get your test results.  Document Released: 12/29/2008 Document Revised: 09/21/2011 Document Reviewed: 12/29/2008 [facility] Patient Information 2012 [employer] [place].

## 2017-05-31 NOTE — Progress Notes (Signed)
    Sonia SaxBridgette D Murphy 1966-08-31 [id number]        51 y.o.  G1P1001 for annual exam.  History of oligomenorrhea using Provera 10 mg 10 days every other month. No bleeding in between. [facility]. Also notes vaginal discharge starting about a week ago with a lot of itching. She took a Diflucan 4 days ago and notes some relief. No odor. No urinary tract symptoms such as frequency dysuria or urgency low back pain fever or chills.  Past medical history,surgical history, problem list, medications, allergies, family history and social history were all reviewed and documented as reviewed in the EPIC chart.  ROS:  Performed with pertinent positives and negatives included in the history, assessment and plan.   Additional significant findings :  None   Exam: Kennon PortelaKim Gardner assistant Vitals:   05/31/17 0908  BP: 122/80  Weight: 245 lb (111.1 kg)  Height: 5' 7.5" (1.715 m)   Body mass index is 37.81 kg/m.  General appearance:  Normal affect, orientation and appearance. Skin: Grossly normal HEENT: Without gross lesions.  No cervical or supraclavicular adenopathy. Thyroid normal.  Lungs:  Clear without wheezing, rales or rhonchi Cardiac: RR, without RMG Abdominal:  Soft, nontender, without masses, guarding, rebound, organomegaly or hernia Breasts:  Examined lying and sitting without masses, retractions, discharge or axillary adenopathy. Pelvic:  Ext, BUS, Vagina: With slight white discharge  Cervix: Normal  Uterus: Anteverted, normal size, shape and contour, midline and mobile nontender   Adnexa: Without masses or tenderness    Anus and perineum: Normal   Rectovaginal: Normal sphincter tone without palpated masses or tenderness.    Assessment/Plan:  51 y.o. 91P1001 female for annual exam menses every other month with Provera withdrawal, condom contraception.   1. Irregular menses. Using Provera for withdrawal every other month. Doing well with this with predictable bleeding and  no in between withdrawal bleeds [facility]. If elevated then we'll stop Provera. If still marginal then plan continue Provera withdrawal every other month. Report any prolonged or atypical bleeding. 2. Vaginal discharge with irritation.  Has taken 1 Diflucan 4 days ago. Wet prep is positive for yeast. Will cover with a second Diflucan 150 mg now. I gave her a total of 5 have available to use as needed as she does get recurrent yeast infections throughout the year. She is familiar with symptoms will take 1 Diflucan at the earliest symptoms. 3. Mammography needs to be scheduled and she is going to call and do this now. Breast exam normal today. 4. Colonoscopy 2017. Repeat at their recommended interval. 5. Pap smear 2016. No Pap smear done today. No history of abnormal Pap smears. Plan repeat Pap smear next year at 3 year interval per current screening guidelines. 6. Health maintenance. No routine blood work done as patient reports this elsewhere. Follow up 1 year, sooner as needed.  Additional time in excess of her routine gynecologic exam was spent in direct face to face counseling and coordination of care in regards to her vaginitis and her perimenopausal bleeding.    Dara LordsFONTAINE,Chevelle Durr P MD, 9:31 AM 05/31/2017

## 2017-06-01 ENCOUNTER — Other Ambulatory Visit: Payer: Self-pay | Admitting: Gynecology

## 2017-06-01 ENCOUNTER — Other Ambulatory Visit: Payer: Self-pay | Admitting: *Deleted

## 2017-06-01 DIAGNOSIS — Z1231 Encounter for screening mammogram for malignant neoplasm of breast: Secondary | ICD-10-CM

## 2017-06-01 LAB — FOLLICLE STIMULATING HORMONE: FSH: 15.3 m[IU]/mL

## 2017-06-01 MED ORDER — MEDROXYPROGESTERONE ACETATE 10 MG PO TABS: ORAL_TABLET | ORAL | 6 refills | Status: DC

## 2017-06-19 ENCOUNTER — Ambulatory Visit
Admission: RE | Admit: 2017-06-19 | Discharge: 2017-06-19 | Disposition: A | Discharge status: Home / Self Care | Payer: BLUE CROSS/BLUE SHIELD | Source: Ambulatory Visit | Attending: Gynecology | Admitting: Gynecology

## 2017-06-19 DIAGNOSIS — Z1231 Encounter for screening mammogram for malignant neoplasm of breast: Secondary | ICD-10-CM

## 2018-06-10 ENCOUNTER — Encounter: Payer: BLUE CROSS/BLUE SHIELD | Admitting: Gynecology

## 2018-06-12 ENCOUNTER — Ambulatory Visit: Payer: BLUE CROSS/BLUE SHIELD | Admitting: Gynecology

## 2018-06-12 ENCOUNTER — Encounter: Payer: Self-pay | Admitting: Gynecology

## 2018-06-12 VITALS — BP 118/76 | Ht 67.5 in | Wt 245.0 lb

## 2018-06-12 DIAGNOSIS — Z01419 Encounter for gynecological examination (general) (routine) without abnormal findings: Secondary | ICD-10-CM

## 2018-06-12 DIAGNOSIS — N926 Irregular menstruation, unspecified: Secondary | ICD-10-CM | POA: Diagnosis not present

## 2018-06-12 MED ORDER — MEDROXYPROGESTERONE ACETATE 10 MG PO TABS: 10.0000 mg | ORAL_TABLET | Freq: Every day | ORAL | 6 refills | Status: DC

## 2018-06-12 NOTE — Patient Instructions (Signed)
Take the Provera medication daily for 10 days every other month as needed to bring on your period.  Follow-up if irregular bleeding or menopausal symptoms develop such as hot flushes or night sweats.  Follow-up in 1 year for annual exam.

## 2018-06-12 NOTE — Progress Notes (Signed)
    Sonia SaxBridgette D Murphy Apr 11, 1966 [id number]        52 y.o.  G1P1001 for annual gynecologic exam.  Doing well without complaints.  Continues to use intermittent progesterone withdrawal due to her history of oligomenorrhea.  No significant hot flushes or sweats.  No intermenstrual bleeding.  FSH normal last year.  Past medical history,surgical history, problem list, medications, allergies, family history and social history were all reviewed and documented as reviewed in the EPIC chart.  ROS:  Performed with pertinent positives and negatives included in the history, assessment and plan.   Additional significant findings : None   Exam: Kennon PortelaKim Gardner assistant Vitals:   06/12/18 1016  BP: 118/76  Weight: 245 lb (111.1 kg)  Height: 5' 7.5" (1.715 m)   Body mass index is 37.81 kg/m.  General appearance:  Normal affect, orientation and appearance. Skin: Grossly normal HEENT: Without gross lesions.  No cervical or supraclavicular adenopathy. Thyroid normal.  Lungs:  Clear without wheezing, rales or rhonchi Cardiac: RR, without RMG Abdominal:  Soft, nontender, without masses, guarding, rebound, organomegaly or hernia Breasts:  Examined lying and sitting without masses, retractions, discharge or axillary adenopathy. Pelvic:  Ext, BUS, Vagina: Normal  Cervix: Normal.  Pap smear done  Uterus: Anteverted, normal size, shape and contour, midline and mobile nontender   Adnexa: Without masses or tenderness    Anus and perineum: Normal   Rectovaginal: Normal sphincter tone without palpated masses or tenderness.    Assessment/Plan:  52 y.o. 641P1001 female for annual gynecologic exam, condom contraception.   1. Irregular menses.  History of irregular menses consistent with oligoovulation.  Uses Provera 10 mg x 10 days every other month to bring on menses.  No bleeding in between.  Refill x6 provided.  Will report if she starts to develop any menopausal symptoms or has any irregular  bleeding. 2. Mammography coming due in September and I reminded her to schedule this.  Breast exam normal today. 3. Pap smear 2016.  Pap smear done today.  No history of significant abnormal Pap smears. 4. Colonoscopy 2017.  Repeat at their recommended interval. 5. Health maintenance.  No routine lab work done as patient reports this done elsewhere.  Follow-up 1 year, sooner as needed.   Dara Lordsimothy P Fontaine MD, 11:04 AM 06/12/2018

## 2018-06-12 NOTE — Addendum Note (Signed)
Addended by: Dayna BarkerGARDNER, Samar Dass K on: 06/12/2018 11:44 AM   Modules accepted: Orders

## 2018-06-13 LAB — PAP IG W/ RFLX HPV ASCU

## 2018-07-01 ENCOUNTER — Other Ambulatory Visit: Payer: Self-pay | Admitting: Gynecology

## 2018-07-01 ENCOUNTER — Ambulatory Visit
Admission: RE | Admit: 2018-07-01 | Discharge: 2018-07-01 | Disposition: A | Discharge status: Home / Self Care | Payer: BLUE CROSS/BLUE SHIELD | Source: Ambulatory Visit

## 2018-07-01 DIAGNOSIS — Z1231 Encounter for screening mammogram for malignant neoplasm of breast: Secondary | ICD-10-CM

## 2019-07-09 ENCOUNTER — Encounter: Payer: Self-pay | Admitting: Gynecology

## 2019-08-15 ENCOUNTER — Other Ambulatory Visit: Payer: Self-pay

## 2019-08-18 ENCOUNTER — Ambulatory Visit (INDEPENDENT_AMBULATORY_CARE_PROVIDER_SITE_OTHER): Payer: BC Managed Care – PPO | Admitting: Gynecology

## 2019-08-18 ENCOUNTER — Encounter: Payer: Self-pay | Admitting: Gynecology

## 2019-08-18 ENCOUNTER — Other Ambulatory Visit: Payer: Self-pay

## 2019-08-18 VITALS — BP 124/76 | Ht 67.0 in | Wt 223.0 lb

## 2019-08-18 DIAGNOSIS — Z01419 Encounter for gynecological examination (general) (routine) without abnormal findings: Secondary | ICD-10-CM | POA: Diagnosis not present

## 2019-08-18 DIAGNOSIS — N951 Menopausal and female climacteric states: Secondary | ICD-10-CM

## 2019-08-18 MED ORDER — MEDROXYPROGESTERONE ACETATE 10 MG PO TABS: 10.0000 mg | ORAL_TABLET | Freq: Every day | ORAL | 5 refills | Status: DC

## 2019-08-18 NOTE — Progress Notes (Signed)
    Sonia Murphy [birth year] [id number]        53 y.o.  G1P1001 for annual gynecologic exam.  Without gynecologic complaints.  Had been having issues with oligomenorrhea in the past using Provera withdrawal every other month.  She has lost some weight since last year and notes that her menses now are more regular occurring every month to every other month spontaneously.  No hot flushes or sweats  Past medical history,surgical history, problem list, medications, allergies, family history and social history were all reviewed and documented as reviewed in the EPIC chart.  ROS:  Performed with pertinent positives and negatives included in the history, assessment and plan.   Additional significant findings : None   Exam: Caryn Bee assistant Vitals:   08/18/19 0910  BP: 124/76  Weight: 223 lb (101.2 kg)  Height: 5\' 7"  (1.702 m)   Body mass index is 34.93 kg/m.  General appearance:  Normal affect, orientation and appearance. Skin: Grossly normal HEENT: Without gross lesions.  No cervical or supraclavicular adenopathy. Thyroid normal.  Lungs:  Clear without wheezing, rales or rhonchi Cardiac: RR, without RMG Abdominal:  Soft, nontender, without masses, guarding, rebound, organomegaly or hernia Breasts:  Examined lying and sitting without masses, retractions, discharge or axillary adenopathy. Pelvic:  Ext, BUS, Vagina: Normal  Cervix: Normal  Uterus: Anteverted, normal size, shape and contour, midline and mobile nontender   Adnexa: Without masses or tenderness    Anus and perineum: Normal   Rectovaginal: Normal sphincter tone without palpated masses or tenderness.    Assessment/Plan:  53 y.o. G18P1001 female for annual gynecologic exam.   1. Perimenopausal.  Having menses now every month to every other month.  No bleeding in between.  No menopausal symptoms.  Using condom contraception which is acceptable to her.  Will check FSH.  If elevated then will monitor menses.  If normal  then will use Provera if she does not have a spontaneous menses at 8 weeks.  Provera 10 mg #10 with 5 refills provided. 2. Mammography due now and I reminded her to call and schedule.  Breast exam normal today. 3. Colonoscopy 2017.  Repeat at their recommended interval. 4. Pap smear 2019.  No Pap smear done today.  No history of abnormal Pap smears.  Plan repeat Pap smear at 3-year interval per current screening guidelines. 5. Health maintenance.  No routine lab work done as patient does this elsewhere.  Follow-up 1 year, sooner as needed.   Anastasio Auerbach MD, 9:43 AM 08/18/2019

## 2019-08-18 NOTE — Patient Instructions (Signed)
Schedule your mammogram.  Follow-up in 1 year for annual exam. 

## 2019-08-20 ENCOUNTER — Other Ambulatory Visit: Payer: Self-pay | Admitting: Internal Medicine

## 2019-08-20 DIAGNOSIS — Z1231 Encounter for screening mammogram for malignant neoplasm of breast: Secondary | ICD-10-CM

## 2019-10-14 ENCOUNTER — Ambulatory Visit
Admission: RE | Admit: 2019-10-14 | Discharge: 2019-10-14 | Disposition: A | Discharge status: Home / Self Care | Payer: BC Managed Care – PPO | Source: Ambulatory Visit | Attending: Internal Medicine | Admitting: Internal Medicine

## 2019-10-14 ENCOUNTER — Other Ambulatory Visit: Payer: Self-pay

## 2019-10-14 DIAGNOSIS — Z1231 Encounter for screening mammogram for malignant neoplasm of breast: Secondary | ICD-10-CM

## 2020-01-05 ENCOUNTER — Ambulatory Visit: Payer: BC Managed Care – PPO

## 2020-08-19 ENCOUNTER — Other Ambulatory Visit: Payer: Self-pay

## 2020-08-19 ENCOUNTER — Encounter: Payer: Self-pay | Admitting: Obstetrics and Gynecology

## 2020-08-19 ENCOUNTER — Ambulatory Visit (INDEPENDENT_AMBULATORY_CARE_PROVIDER_SITE_OTHER): Payer: BC Managed Care – PPO | Admitting: Obstetrics and Gynecology

## 2020-08-19 VITALS — BP 124/78 | Ht 67.0 in | Wt 243.0 lb

## 2020-08-19 DIAGNOSIS — Z01419 Encounter for gynecological examination (general) (routine) without abnormal findings: Secondary | ICD-10-CM

## 2020-08-19 DIAGNOSIS — Z23 Encounter for immunization: Secondary | ICD-10-CM

## 2020-08-19 DIAGNOSIS — N951 Menopausal and female climacteric states: Secondary | ICD-10-CM

## 2020-08-19 NOTE — Progress Notes (Signed)
   Sonia Murphy [birth year] [id number]  SUBJECTIVE:  54 y.o. G83P1001 female for annual routine gynecologic exam. Occasional urinary frequency at night but no dysuria, symptoms have improved on their own in the last several days. She has no gynecologic concerns.  Current Outpatient Medications  Medication Sig Dispense Refill  . cetirizine (ZYRTEC) 10 MG tablet Take 10 mg by mouth daily.    Marland Kitchen lisinopril (PRINIVIL,ZESTRIL) 5 MG tablet Take 20 mg by mouth daily.     . Multiple Vitamin (MULTI-VITAMIN PO) Take by mouth.    . medroxyPROGESTERone (PROVERA) 10 MG tablet Take 1 tablet (10 mg total) by mouth daily. (Patient not taking: Reported on 08/19/2020) 10 tablet 5   Current Facility-Administered Medications  Medication Dose Route Frequency Provider Last Rate Last Admin  . 0.9 %  sodium chloride infusion  500 mL Intravenous Continuous Hilarie Fredrickson, MD       Allergies: Penicillins  No LMP recorded. (Menstrual status: Perimenopausal).  Past medical history,surgical history, problem list, medications, allergies, family history and social history were all reviewed and documented as reviewed in the EPIC chart.  ROS: Pertinent positives and negatives as reviewed in HPI   OBJECTIVE:  BP 124/78   Ht 5\' 7"  (1.702 m)   Wt 243 lb (110.2 kg)   BMI 38.06 kg/m  The patient appears well, alert, oriented, in no distress. ENT normal.  Lungs are clear, good air entry, no wheezes, rhonchi or rales. S1 and S2 normal, no murmurs, regular rate and rhythm.  Abdomen soft without tenderness, guarding, mass or organomegaly.  Neurological is normal, no focal findings.  BREAST EXAM: breasts appear normal, no suspicious masses, no skin or nipple changes or axillary nodes  PELVIC EXAM: VULVA: normal appearing vulva with no masses, tenderness or lesions, VAGINA: normal appearing vagina with normal color and discharge, no lesions, CERVIX: normal appearing cervix without discharge or lesions, UTERUS & ADNEXA:  difficult to palpate, no notable abnormalities, normal adnexa in size, nontender and no masses  Chaperone: present during the examination  ASSESSMENT:  54 y.o. G1P1001 here for annual gynecologic exam  PLAN:   1. Perimenopausal.  Order placed for [facility] last year but not she did not get the test done.  FSH ordered if she would like to complete this.  Discussed perimenopausal transition and symptoms.  Sometimes nocturia can be an issue into menopause, but she does not describe symptoms consistent with a UTI given the improvement in symptoms and timeliness just at night.  Will notify [facility] if symptoms get worse.  Has not had a period in 5 months which is much longer than she has ever gone in the past without menses, so I believe this is an indicator of perimenopause and she does not need to continue the Provera at this time.  Will let us know if there is any excessive bleeding. 2. Pap smear 2019.  No significant history of abnormal Pap smears.  Next Pap smear due 2022 following the current guidelines recommending the 3 year interval. 3. Mammogram 09/2019.  Normal breast exam today.  She is reminded to schedule an annual mammogram this year when due. 4. Colonoscopy 2017.  Recommended that she follow up at the recommended interval.   5. Health maintenance.  No labs today as she normally has these completed elsewhere.  Return annually or sooner, prn.  2018 MD 08/19/20

## 2020-09-01 ENCOUNTER — Other Ambulatory Visit: Payer: Self-pay | Admitting: Obstetrics and Gynecology

## 2020-09-01 DIAGNOSIS — Z1231 Encounter for screening mammogram for malignant neoplasm of breast: Secondary | ICD-10-CM

## 2020-10-14 ENCOUNTER — Ambulatory Visit
Admission: RE | Admit: 2020-10-14 | Discharge: 2020-10-14 | Disposition: A | Discharge status: Home / Self Care | Payer: BC Managed Care – PPO | Source: Ambulatory Visit | Attending: Obstetrics and Gynecology | Admitting: Obstetrics and Gynecology

## 2020-10-14 ENCOUNTER — Other Ambulatory Visit: Payer: Self-pay

## 2020-10-14 DIAGNOSIS — Z1231 Encounter for screening mammogram for malignant neoplasm of breast: Secondary | ICD-10-CM

## 2021-01-10 NOTE — Progress Notes (Unsigned)
GYNECOLOGY  VISIT  CC:   Developed a rash under her right breast, started  a month ago, improved for a little bit but then it came back  HPI: 55 y.o. G1P1001 Single Black or African American female here for rash under right breast. The rash is mildly itchy, but it is raw. Notices a foul odor underneath.  Used neosporin and it helped after 2 days Pt works at [employer] and has a very physical job  States no other problems with breasts, does not feel any "knots"    GYNECOLOGIC HISTORY: Patient's last menstrual period was around June 2021 per Dr. Penni Bombard note in plan 08/2020 Contraception: post menopausal no cycle in about 69yr Menopausal hormone therapy: none  Patient Active Problem List   Diagnosis Date Noted  . Diverticulitis of colon without hemorrhage 08/15/2016  . Upper respiratory infection     Past Medical History:  Diagnosis Date  . Allergy   . Anemia    with pregnancy  . Diverticulosis    diverticulitis- 2014,tx with oral antibiotics  . Hypertension     Past Surgical History:  Procedure Laterality Date  . CESAREAN SECTION    . FOOT SURGERY      MEDS:   Current Outpatient Medications on File Prior to Visit  Medication Sig Dispense Refill  . cetirizine (ZYRTEC) 10 MG tablet Take 10 mg by mouth daily.    Marland Kitchen lisinopril (ZESTRIL) 40 MG tablet Take 40 mg by mouth daily.    . Multiple Vitamin (MULTI-VITAMIN PO) Take by mouth.     Current Facility-Administered Medications on File Prior to Visit  Medication Dose Route Frequency Provider Last Rate Last Admin  . 0.9 %  sodium chloride infusion  500 mL Intravenous Continuous Hilarie Fredrickson, MD        ALLERGIES: Penicillins  Family History  Problem Relation Age of Onset  . Hypertension Mother   . Diabetes Mother   . Breast cancer Mother 75  . Hypertension Father   . Diabetes Brother   . Diabetes Sister   . Multiple sclerosis Sister   . Colon cancer Neg Hx   . Esophageal cancer Neg Hx   . Rectal cancer Neg Hx   .  Stomach cancer Neg Hx      Review of Systems  Constitutional:       Rash under right breast  HENT: Negative.   Eyes: Negative.   Respiratory: Negative.   Cardiovascular: Negative.   Gastrointestinal: Negative.   Endocrine: Negative.   Genitourinary: Negative.   Musculoskeletal: Negative.   Skin: Negative.   Allergic/Immunologic: Negative.   Neurological: Negative.   Hematological: Negative.   Psychiatric/Behavioral: Negative.     PHYSICAL EXAMINATION:    BP 118/80   Pulse 78   Resp 16   Wt 243 lb (110.2 kg)   LMP 04/30/2018 Comment: around 3yr ago  BMI 38.06 kg/m     General appearance: alert, cooperative, no acute distress  Skin: dark skin under both breast, more inflammatory, shiny, moist, and red under right breast. Skin intact bilaterally.        Right breast larger than left which may explain why worse under right breast than left.      Assessment/Plan: Intertrigo - Plan: triamcinolone (KENALOG) 0.1 %, nystatin cream (MYCOSTATIN)  Counseled regarding care of skin. Written information provided. Encouraged to keep skin clean and dry  Enocouraged to wear a sports bra at work  F/U for annual exam 08/2021 or sooner if needed

## 2021-01-11 ENCOUNTER — Encounter: Payer: Self-pay | Admitting: Nurse Practitioner

## 2021-01-11 ENCOUNTER — Other Ambulatory Visit: Payer: Self-pay

## 2021-01-11 ENCOUNTER — Ambulatory Visit: Payer: BC Managed Care – PPO | Admitting: Nurse Practitioner

## 2021-01-11 VITALS — BP 118/80 | HR 78 | Resp 16 | Wt 243.0 lb

## 2021-01-11 DIAGNOSIS — L304 Erythema intertrigo: Secondary | ICD-10-CM | POA: Diagnosis not present

## 2021-01-11 MED ORDER — TRIAMCINOLONE ACETONIDE 0.1 % EX CREA: TOPICAL_CREAM | CUTANEOUS | 1 refills | Status: DC

## 2021-01-11 MED ORDER — NYSTATIN 100000 UNIT/GM EX CREA: TOPICAL_CREAM | CUTANEOUS | 1 refills | Status: DC

## 2021-01-11 NOTE — Patient Instructions (Addendum)
Intertrigo Intertrigo is skin irritation (inflammation) that happens in warm, moist areas of the body. The irritation can cause a rash and make skin raw and itchy. The rash is usually pink or red. It happens mostly between folds of skin or where skin rubs together, such as:  Between the toes.  In the armpits.  In the groin area.  Under the belly.  Under the breasts.  Around the butt area. This condition is not passed from person to person (is not contagious). What are the causes?  Heat, moisture, rubbing, and not enough air movement.  The condition can be made worse by: ? Sweat. ? Bacteria. ? A fungus, such as yeast. What increases the risk?  Moisture in your skin folds.  You are more likely to develop this condition if you: ? Have diabetes. ? Are overweight. ? Are not able to move around. ? Live in a warm and moist climate. ? Wear splints, braces, or other medical devices. ? Are not able to control your pee (urine) or poop (stool). What are the signs or symptoms?  A pink or red skin rash in the skin fold or near the skin fold.  Raw or scaly skin.  Itching.  A burning feeling.  Bleeding.  Leaking fluid.  A bad smell. How is this treated?  Cleaning and drying your skin.  Taking an antibiotic medicine or using an antibiotic skin cream for a bacterial infection.  Using an antifungal cream on your skin or taking pills for an infection that was caused by a fungus, such as yeast.  Using a steroid ointment to stop the itching and irritation.  Separating the skin fold with a clean cotton cloth to absorb moisture and allow air to flow into the area. Follow these instructions at home:  Keep the affected area clean and dry.  Do not scratch your skin.  Stay cool as much as you can. Use an air conditioner or a fan, if you have one.  Apply over-the-counter and prescription medicines only as told by your doctor.  If you were prescribed an antibiotic medicine,  use it as told by your doctor. Do not stop using the antibiotic even if your condition starts to get better.  Keep all follow-up visits as told by your doctor. This is important. How is this prevented?  Stay at a healthy weight.  Take care of your feet. This is very important if you have diabetes. You should: ? Wear shoes that fit well. ? Keep your feet dry. ? Wear clean cotton or wool socks.  Protect the skin in your groin and butt area as told by your doctor. To do this: ? Follow a regular cleaning routine. ? Use creams, powders, or ointments that protect your skin. ? Change protection pads often.  Do not wear tight clothes. Wear clothes that: ? Are loose. ? Take moisture away from your body. ? Are made of cotton.  Wear a bra that gives good support, if needed.  Shower and dry yourself well after being active. Use a hair dryer on a cool setting to dry between skin folds.  Keep your blood sugar under control if you have diabetes.   Contact a doctor if:  Your symptoms do not get better with treatment.  Your symptoms get worse or they spread.  You notice more redness and warmth.  You have a fever. Summary  Intertrigo is skin irritation that occurs when folds of skin rub together.  This condition is caused by heat,   moisture, and rubbing.  This condition may be treated by cleaning and drying your skin and with medicines.  Apply over-the-counter and prescription medicines only as told by your doctor.  Keep all follow-up visits as told by your doctor. This is important. This information is not intended to replace advice given to you by your health care provider. Make sure you discuss any questions you have with your health care provider. Document Revised: 07/11/2018 Document Reviewed: 07/11/2018 Elsevier Patient Education  2021 Elsevier Inc.  

## 2021-02-09 IMAGING — MG DIGITAL SCREENING BILAT W/ TOMO W/ CAD
6 of 10 series · 6 of 30 positions shown · non-contrast
Comparison: Previous exam(s).

CLINICAL DATA: Screening.

EXAM:
DIGITAL SCREENING BILATERAL MAMMOGRAM WITH TOMO AND CAD

[R MLO synth-2D (1 of 2)]
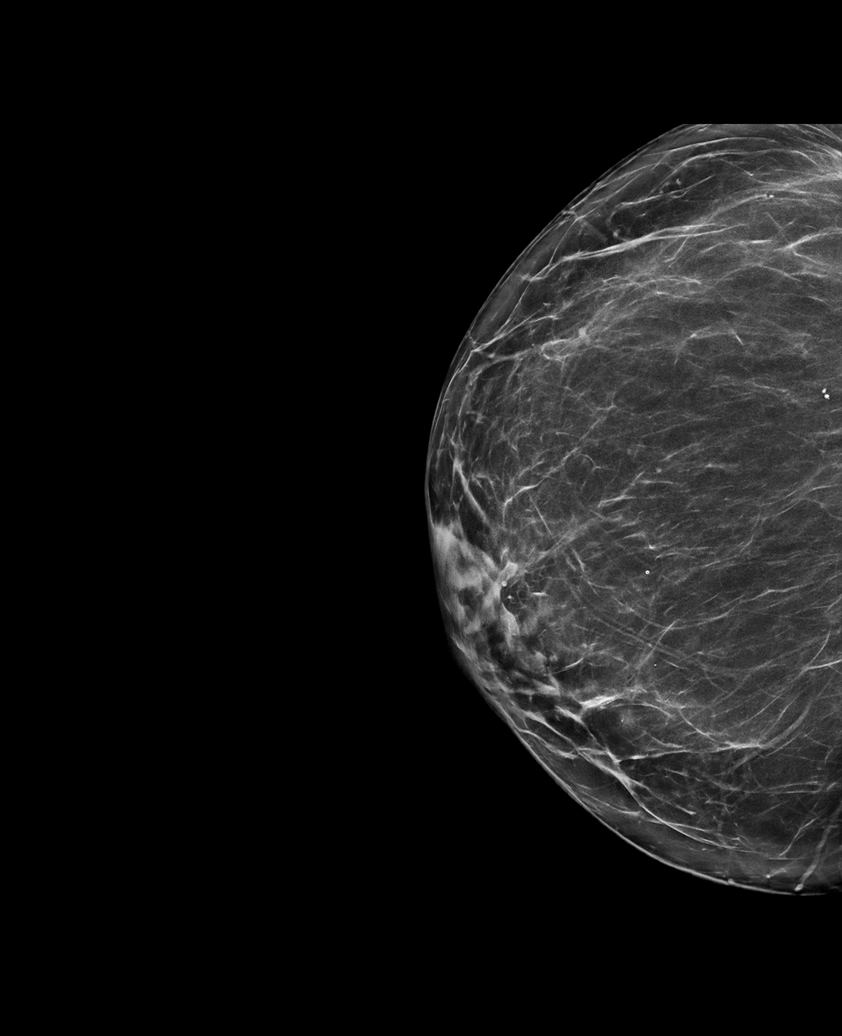

[L CC synth-2D]
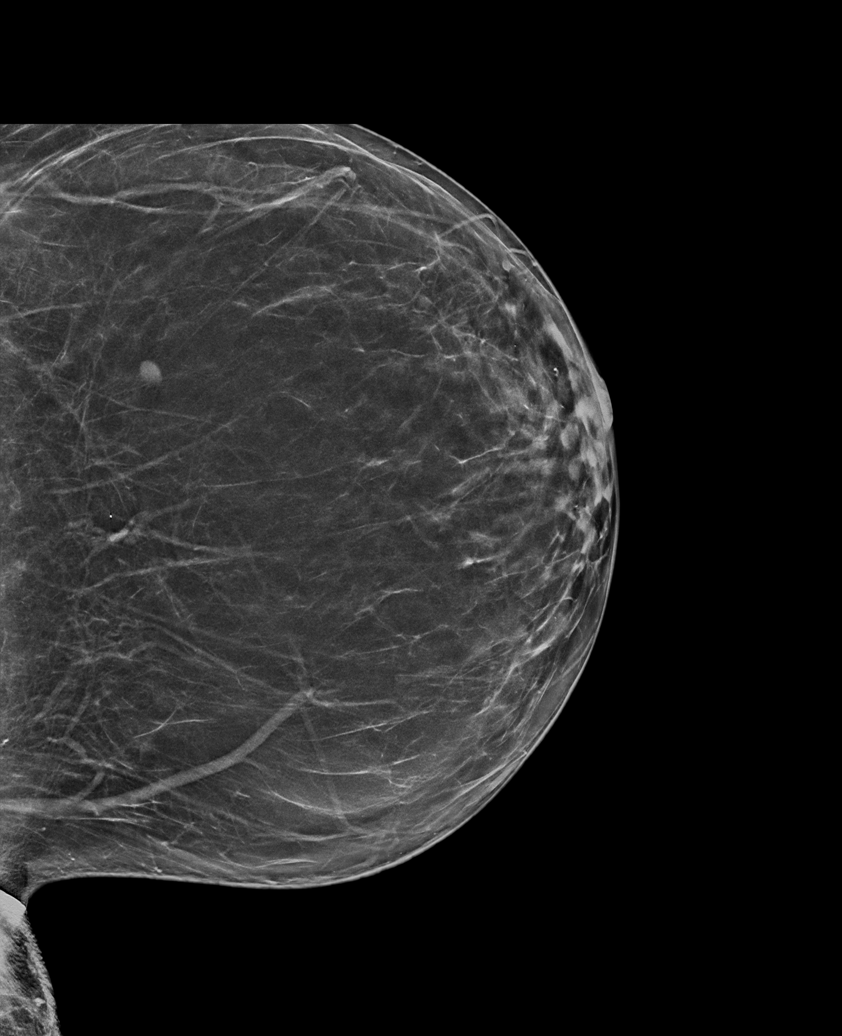

[R MLO synth-2D (2 of 2)]
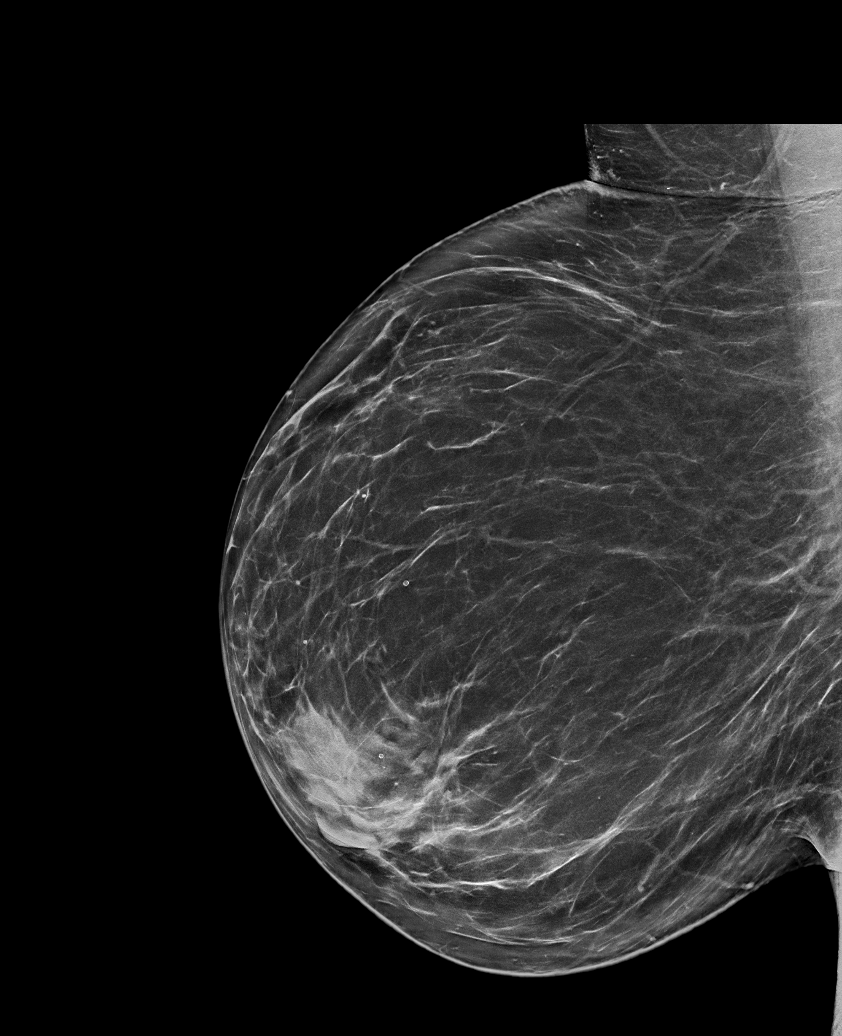

[R CC synth-2D]
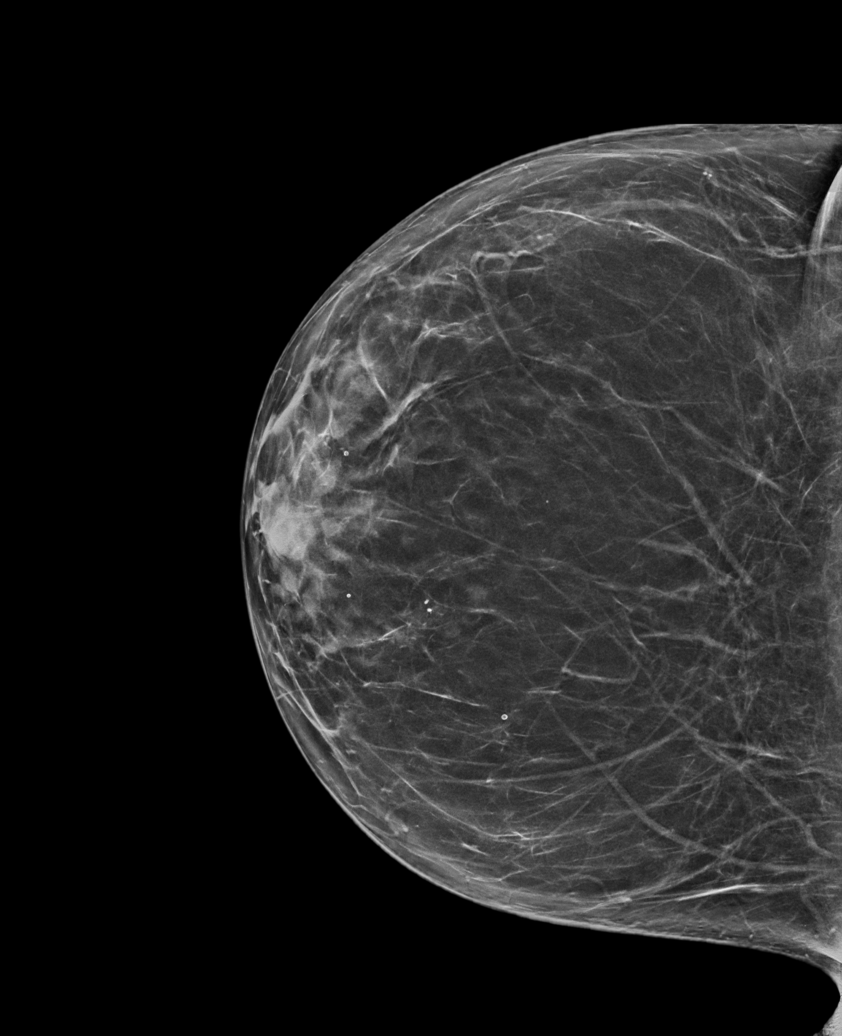

[L MLO synth-2D]
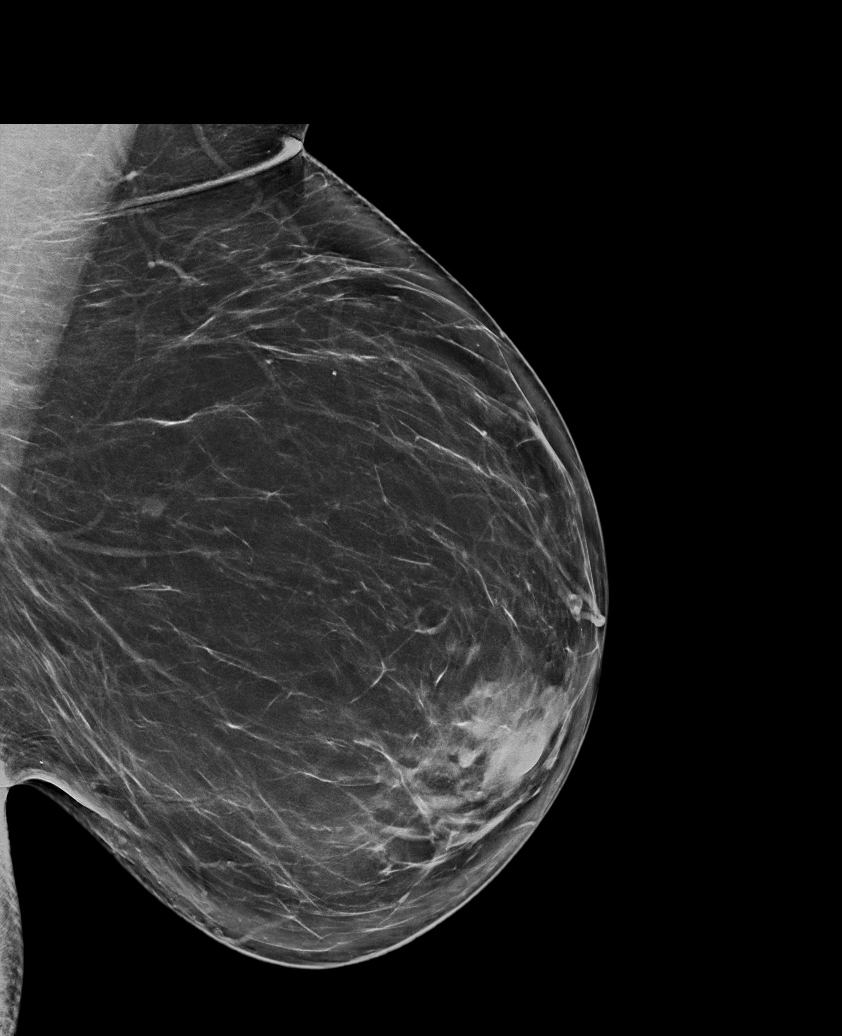

[R CC tomo · tomo slice 39/77.0]
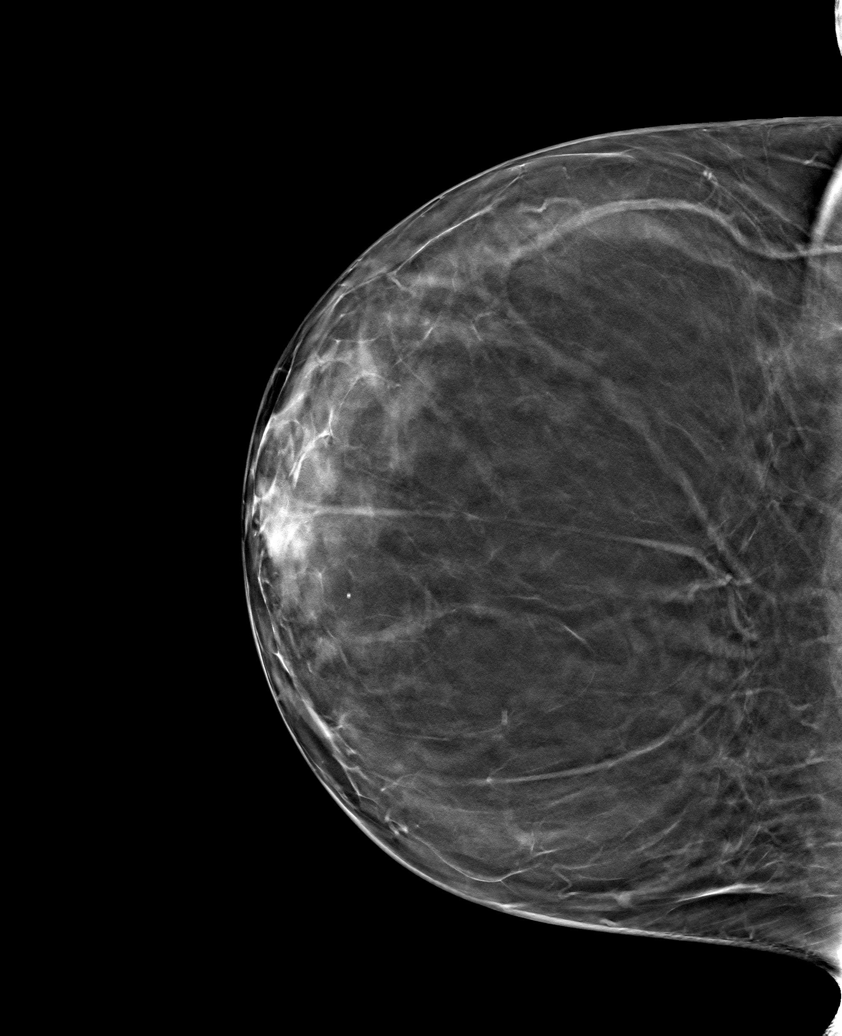

[6 of 30 positions shown; findings below may reference images not displayed]

ACR Breast Density Category b: There are scattered areas of
fibroglandular density.
FINDINGS: There are no findings suspicious for malignancy. Images were
processed with CAD.
IMPRESSION: No mammographic evidence of malignancy. A result letter of this
screening mammogram will be mailed directly to the patient.

RECOMMENDATION:
Screening mammogram in one year. (Code:[id number])

BI-RADS CATEGORY  1: Negative.

## 2021-08-23 ENCOUNTER — Encounter: Payer: BC Managed Care – PPO | Admitting: Obstetrics & Gynecology

## 2021-08-23 ENCOUNTER — Encounter: Payer: BC Managed Care – PPO | Admitting: Obstetrics and Gynecology

## 2021-08-24 ENCOUNTER — Other Ambulatory Visit (HOSPITAL_COMMUNITY)
Admission: RE | Admit: 2021-08-24 | Discharge: 2021-08-24 | Disposition: A | Discharge status: Home / Self Care | Payer: BC Managed Care – PPO | Source: Ambulatory Visit | Attending: Obstetrics & Gynecology | Admitting: Obstetrics & Gynecology

## 2021-08-24 ENCOUNTER — Ambulatory Visit (INDEPENDENT_AMBULATORY_CARE_PROVIDER_SITE_OTHER): Payer: BC Managed Care – PPO | Admitting: Obstetrics & Gynecology

## 2021-08-24 ENCOUNTER — Other Ambulatory Visit: Payer: Self-pay

## 2021-08-24 ENCOUNTER — Encounter: Payer: Self-pay | Admitting: Obstetrics & Gynecology

## 2021-08-24 VITALS — BP 110/80 | HR 72 | Resp 16 | Ht 66.75 in | Wt 238.0 lb

## 2021-08-24 DIAGNOSIS — Z780 Asymptomatic menopausal state: Secondary | ICD-10-CM

## 2021-08-24 DIAGNOSIS — N841 Polyp of cervix uteri: Secondary | ICD-10-CM | POA: Diagnosis present

## 2021-08-24 DIAGNOSIS — Z6837 Body mass index (BMI) 37.0-37.9, adult: Secondary | ICD-10-CM

## 2021-08-24 DIAGNOSIS — E6609 Other obesity due to excess calories: Secondary | ICD-10-CM | POA: Diagnosis not present

## 2021-08-24 DIAGNOSIS — Z01419 Encounter for gynecological examination (general) (routine) without abnormal findings: Secondary | ICD-10-CM | POA: Insufficient documentation

## 2021-08-24 NOTE — Progress Notes (Signed)
Sonia Murphy [birth year] [id number]   History:    55 y.o. G1P0A1  RP:  Established patient presenting for annual gyn exam   HPI: Postmenopausal, well on no HRT.  No PMB.  No pelvic pain.  Pap smear 2019 Neg.  Occasionally sexually active with stable partner.  Declines STI screen.  Breasts normal.  Mammogram Neg 09/2020, scheduled for 09/2021.  Colonoscopy 2017.  BMI 37.56.  Needs to increase fitness and decrease calories/carbs in diet.  Health labs with Fam MD.    Past medical history,surgical history, family history and social history were all reviewed and documented in the EPIC chart.  Gynecologic History Patient's last menstrual period was 04/30/2018.  Obstetric History OB History  Gravida Para Term Preterm AB Living  1 1 1     1   SAB IAB Ectopic Multiple Live Births               # Outcome Date GA Lbr Len/2nd Weight Sex Delivery Anes PTL Lv  1 Term              ROS: A ROS was performed and pertinent positives and negatives are included in the history.  GENERAL: No fevers or chills. HEENT: No change in vision, no earache, sore throat or sinus congestion. NECK: No pain or stiffness. CARDIOVASCULAR: No chest pain or pressure. No palpitations. PULMONARY: No shortness of breath, cough or wheeze. GASTROINTESTINAL: No abdominal pain, nausea, vomiting or diarrhea, melena or bright red blood per rectum. GENITOURINARY: No urinary frequency, urgency, hesitancy or dysuria. MUSCULOSKELETAL: No joint or muscle pain, no back pain, no recent trauma. DERMATOLOGIC: No rash, no itching, no lesions. ENDOCRINE: No polyuria, polydipsia, no heat or cold intolerance. No recent change in weight. HEMATOLOGICAL: No anemia or easy bruising or bleeding. NEUROLOGIC: No headache, seizures, numbness, tingling or weakness. PSYCHIATRIC: No depression, no loss of interest in normal activity or change in sleep pattern.     Exam:   BP 110/80   Pulse 72   Resp 16   Ht 5' 6.75" (1.695 m)   Wt 238 lb  (108 kg)   LMP 04/30/2018 Comment: around 20yr ago  BMI 37.56 kg/m   Body mass index is 37.56 kg/m.  General appearance : Well developed well nourished female. No acute distress HEENT: Eyes: no retinal hemorrhage or exudates,  Neck supple, trachea midline, no carotid bruits, no thyroidmegaly Lungs: Clear to auscultation, no rhonchi or wheezes, or rib retractions  Heart: Regular rate and rhythm, no murmurs or gallops Breast:Examined in sitting and supine position were symmetrical in appearance, no palpable masses or tenderness,  no skin retraction, no nipple inversion, no nipple discharge, no skin discoloration, no axillary or supraclavicular lymphadenopathy Abdomen: no palpable masses or tenderness, no rebound or guarding Extremities: no edema or skin discoloration or tenderness  Pelvic: Vulva: Normal             Vagina: No gross lesions or discharge  Cervix: Pap reflex done.  Endocervical Polyp visible at EO.  Excision of Polyp with a fenestrated clamp.  Silver Nitrate applied at the base.  Uterus  AV, normal size, shape and consistency, non-tender and mobile  Adnexa  Without masses or tenderness  Anus: Normal   Assessment/Plan:  55 y.o. female for annual exam   1. Encounter for routine gynecological examination with Papanicolaou smear of cervix Postmenopausal, well on no HRT.  No PMB.  No pelvic pain.  Pap smear 2019 Neg.  Occasionally sexually active with stable partner.  Declines STI screen.  Breasts normal.  Mammogram Neg 09/2020, scheduled for 09/2021.  Colonoscopy 2017.  BMI 37.56.  Needs to increase fitness and decrease calories/carbs in diet.  Health labs with Fam MD.  - Cytology - PAP( [facility])  2. Postmenopause Well on no HRT, no PMB.  3. Endocervical polyp Excised.  Patho pending. - Surgical pathology( / [facility])  4. Class 2 obesity due to excess calories without serious comorbidity with body mass index (BMI) of 37.0 to 37.9 in adult   Needs to  increase fitness and decrease calories/carbs in diet.    Genia Del MD, 9:46 AM 08/24/2021

## 2021-08-25 LAB — CYTOLOGY - PAP: Diagnosis: NEGATIVE

## 2021-08-25 LAB — SURGICAL PATHOLOGY

## 2021-09-05 ENCOUNTER — Other Ambulatory Visit: Payer: Self-pay | Admitting: Obstetrics & Gynecology

## 2021-09-05 DIAGNOSIS — Z1231 Encounter for screening mammogram for malignant neoplasm of breast: Secondary | ICD-10-CM

## 2021-09-07 DIAGNOSIS — Z1231 Encounter for screening mammogram for malignant neoplasm of breast: Secondary | ICD-10-CM

## 2021-09-25 ENCOUNTER — Ambulatory Visit (HOSPITAL_COMMUNITY)
Admission: EM | Admit: 2021-09-25 | Discharge: 2021-09-25 | Disposition: A | Discharge status: Home / Self Care | Payer: BC Managed Care – PPO | Attending: Emergency Medicine | Admitting: Emergency Medicine

## 2021-09-25 ENCOUNTER — Other Ambulatory Visit: Payer: Self-pay

## 2021-09-25 ENCOUNTER — Encounter (HOSPITAL_COMMUNITY): Payer: Self-pay | Admitting: *Deleted

## 2021-09-25 DIAGNOSIS — Z1152 Encounter for screening for COVID-19: Secondary | ICD-10-CM | POA: Insufficient documentation

## 2021-09-25 DIAGNOSIS — J069 Acute upper respiratory infection, unspecified: Secondary | ICD-10-CM | POA: Insufficient documentation

## 2021-09-25 LAB — POC INFLUENZA A AND B ANTIGEN (URGENT CARE ONLY)
INFLUENZA A ANTIGEN, POC: NEGATIVE
INFLUENZA B ANTIGEN, POC: NEGATIVE

## 2021-09-25 MED ORDER — BENZONATATE 100 MG PO CAPS: 100.0000 mg | ORAL_CAPSULE | Freq: Three times a day (TID) | ORAL | 0 refills | Status: DC | PRN

## 2021-09-25 MED ORDER — ALBUTEROL SULFATE HFA 108 (90 BASE) MCG/ACT IN AERS: 1.0000 | INHALATION_SPRAY | Freq: Four times a day (QID) | RESPIRATORY_TRACT | 0 refills | Status: DC | PRN

## 2021-09-25 NOTE — ED Provider Notes (Signed)
[facility]   [id number] 09/25/21 Arrival Time: 1023   Chief Complaint  Patient presents with   Headache   Nasal Congestion   Cough      SUBJECTIVE: History from: patient.  Sonia Murphy is a 55 y.o. female who presented to the urgent care with a complaint of nasal congestion, cough and headache for the past 3 days.  Denies sick exposure to COVID, flu or strep.  Denies recent travel.  Has not tried any OTC medication.  Denies alleviating or aggravating factors.  Denies similar symptoms in the past.   Denies fever, chills, fatigue, sinus pain, rhinorrhea, sore throat, SOB, wheezing, chest pain, nausea, changes in bowel or bladder habits.      ROS: As per HPI.  All other pertinent ROS negative.     Past Medical History:  Diagnosis Date   Allergy    Anemia    with pregnancy   Diverticulosis    diverticulitis- 2014,tx with oral antibiotics   Hypertension    Past Surgical History:  Procedure Laterality Date   CESAREAN SECTION     FOOT SURGERY     Allergies  Allergen Reactions   Penicillins Hives   No current facility-administered medications on file prior to encounter.   Current Outpatient Medications on File Prior to Encounter  Medication Sig Dispense Refill   cetirizine (ZYRTEC) 10 MG tablet Take 10 mg by mouth daily.     lisinopril (ZESTRIL) 40 MG tablet Take 40 mg by mouth daily.     Multiple Vitamin (MULTI-VITAMIN PO) Take by mouth.     Social History   Socioeconomic History   Marital status: Single    Spouse name: Not on file   Number of children: Not on file   Years of education: Not on file   Highest education level: Not on file  Occupational History   Not on file  Tobacco Use   Smoking status: Former    Types: Cigarettes    Quit date: 12/21/2010    Years since quitting: 10.7   Smokeless tobacco: Never  Vaping Use   Vaping Use: Former  Substance and Sexual Activity   Alcohol use: Yes    Comment: once or twice a month   Drug use:  No   Sexual activity: Not Currently    Partners: Male    Birth control/protection: Post-menopausal  Other Topics Concern   Not on file  Social History Narrative   Not on file   Social Determinants of Health   Financial Resource Strain: Not on file  Food Insecurity: Not on file  Transportation Needs: Not on file  Physical Activity: Not on file  Stress: Not on file  Social Connections: Not on file  Intimate Partner Violence: Not on file   Family History  Problem Relation Age of Onset   Hypertension Mother    Diabetes Mother    Breast cancer Mother 70   Hypertension Father    Diabetes Brother    Diabetes Sister    Multiple sclerosis Sister    Colon cancer Neg Hx    Esophageal cancer Neg Hx    Rectal cancer Neg Hx    Stomach cancer Neg Hx     OBJECTIVE:  Vitals:   09/25/21 1137  BP: (!) 154/82  Pulse: 65  Resp: 20  Temp: 97.7 F (36.5 C)  SpO2: 98%     General appearance: alert; appears fatigued, but nontoxic; speaking in full sentences and tolerating own secretions HEENT: NCAT; Ears: EACs  clear, TMs pearly gray; Eyes: PERRL.  EOM grossly intact. Sinuses: nontender; Nose: nares patent without rhinorrhea, Throat: oropharynx clear, tonsils non erythematous or enlarged, uvula midline  Neck: supple without LAD Lungs: unlabored respirations, symmetrical air entry; cough: mild; no respiratory distress; CTAB Heart: regular rate and rhythm.  Radial pulses 2+ symmetrical bilaterally Skin: warm and dry Psychological: alert and cooperative; normal mood and affect  LABS:  Results for orders placed or performed during the hospital encounter of 09/25/21 (from the past 24 hour(s))  POC Influenza A & B Ag (Urgent Care)     Status: None   Collection Time: 09/25/21  1:37 PM  Result Value Ref Range   INFLUENZA A ANTIGEN, POC NEGATIVE NEGATIVE   INFLUENZA B ANTIGEN, POC NEGATIVE NEGATIVE     ASSESSMENT & PLAN:  1. URI with cough and congestion   2. Encounter for screening  for COVID-19     Meds ordered this encounter  Medications   benzonatate (TESSALON) 100 MG capsule    Sig: Take 1 capsule (100 mg total) by mouth 3 (three) times daily as needed for cough.    Dispense:  30 capsule    Refill:  0   albuterol (VENTOLIN HFA) 108 (90 Base) MCG/ACT inhaler    Sig: Inhale 1-2 puffs into the lungs every 6 (six) hours as needed for wheezing or shortness of breath.    Dispense:  18 g    Refill:  0    Discharge instructions  COVID testing ordered.  It will take between 2-7 days for test results.  Someone will contact you regarding abnormal results.   Influenza A/B test was negative Get plenty of rest and push fluids Tessalon Perles prescribed for cough ProAir was prescribed  use medications daily for symptom relief Use OTC medications like ibuprofen or tylenol as needed fever or pain Call or go to the ED if you have any new or worsening symptoms such as fever, worsening cough, shortness of breath, chest tightness, chest pain, turning blue, changes in mental status, etc...   Reviewed expectations re: course of current medical issues. Questions answered. Outlined signs and symptoms indicating need for more acute intervention. Patient verbalized understanding. After Visit Summary given.          Durward Parcel, FNP 09/25/21 1341

## 2021-09-25 NOTE — ED Triage Notes (Signed)
Pt reports 3 days ago she started having a HA,cough and congestion.

## 2021-09-25 NOTE — Discharge Instructions (Addendum)
COVID testing ordered.  It will take between 2-7 days for test results.  Someone will contact you regarding abnormal results.   Influenza A/B test was negative Get plenty of rest and push fluids Tessalon Perles prescribed for cough ProAir was prescribed  use medications daily for symptom relief Use OTC medications like ibuprofen or tylenol as needed fever or pain Call or go to the ED if you have any new or worsening symptoms such as fever, worsening cough, shortness of breath, chest tightness, chest pain, turning blue, changes in mental status, etc..Marland Kitchen

## 2021-09-26 LAB — SARS CORONAVIRUS 2 (TAT 6-24 HRS): SARS Coronavirus 2: POSITIVE — AB

## 2021-10-19 ENCOUNTER — Ambulatory Visit
Admission: RE | Admit: 2021-10-19 | Discharge: 2021-10-19 | Disposition: A | Discharge status: Home / Self Care | Payer: BC Managed Care – PPO | Source: Ambulatory Visit

## 2021-10-19 DIAGNOSIS — Z1231 Encounter for screening mammogram for malignant neoplasm of breast: Secondary | ICD-10-CM

## 2022-08-25 ENCOUNTER — Encounter: Payer: Self-pay | Admitting: Obstetrics & Gynecology

## 2022-08-25 ENCOUNTER — Telehealth: Payer: Self-pay

## 2022-08-25 ENCOUNTER — Ambulatory Visit (INDEPENDENT_AMBULATORY_CARE_PROVIDER_SITE_OTHER): Payer: BC Managed Care – PPO | Admitting: Obstetrics & Gynecology

## 2022-08-25 VITALS — BP 122/82 | HR 84 | Ht 66.5 in | Wt 245.0 lb

## 2022-08-25 DIAGNOSIS — Z23 Encounter for immunization: Secondary | ICD-10-CM | POA: Diagnosis not present

## 2022-08-25 DIAGNOSIS — Z01419 Encounter for gynecological examination (general) (routine) without abnormal findings: Secondary | ICD-10-CM

## 2022-08-25 DIAGNOSIS — E66812 Obesity, class 2: Secondary | ICD-10-CM

## 2022-08-25 DIAGNOSIS — Z780 Asymptomatic menopausal state: Secondary | ICD-10-CM

## 2022-08-25 NOTE — Telephone Encounter (Signed)
-----   Message from Genia Del, MD sent at 08/25/2022  2:47 PM EST ----- Regarding: Refer to [facility] Obesity, weight loss.

## 2022-08-25 NOTE — Telephone Encounter (Signed)
Referral sent 

## 2022-08-25 NOTE — Progress Notes (Signed)
Sonia Murphy 1965-11-30 [id number]   History:    56 y.o. G1P1L1 Single.  Daughter 23+ yo.   RP:  Established patient presenting for annual gyn exam    HPI: Postmenopausal, well on no HRT.  No PMB.  No pelvic pain.  Pap smear 08/2021 Neg.  Abstinent x last Pap.  No indication for a Pap at this time.  Declines STI screen.  Breasts normal. Mammogram Neg 10/2021.  Colonoscopy 2017.  BMI 38.95.  Needs to increase fitness and decrease calories/carbs in diet.  Health labs with Fam MD.  Flu vaccine given here today   Past medical history,surgical history, family history and social history were all reviewed and documented in the EPIC chart.  Gynecologic History Patient's last menstrual period was 04/30/2018.  Obstetric History OB History  Gravida Para Term Preterm AB Living  1 1 1     1   SAB IAB Ectopic Multiple Live Births               # Outcome Date GA Lbr Len/2nd Weight Sex Delivery Anes PTL Lv  1 Term             ROS: A ROS was performed and pertinent positives and negatives are included in the history. GENERAL: No fevers or chills. HEENT: No change in vision, no earache, sore throat or sinus congestion. NECK: No pain or stiffness. CARDIOVASCULAR: No chest pain or pressure. No palpitations. PULMONARY: No shortness of breath, cough or wheeze. GASTROINTESTINAL: No abdominal pain, nausea, vomiting or diarrhea, melena or bright red blood per rectum. GENITOURINARY: No urinary frequency, urgency, hesitancy or dysuria. MUSCULOSKELETAL: No joint or muscle pain, no back pain, no recent trauma. DERMATOLOGIC: No rash, no itching, no lesions. ENDOCRINE: No polyuria, polydipsia, no heat or cold intolerance. No recent change in weight. HEMATOLOGICAL: No anemia or easy bruising or bleeding. NEUROLOGIC: No headache, seizures, numbness, tingling or weakness. PSYCHIATRIC: No depression, no loss of interest in normal activity or change in sleep pattern.     Exam:   BP 122/82   Pulse 84   Ht 5'  6.5" (1.689 m)   Wt 245 lb (111.1 kg)   LMP 04/30/2018 Comment: around 62yr ago  SpO2 98%   BMI 38.95 kg/m   Body mass index is 38.95 kg/m.  General appearance : Well developed well nourished female. No acute distress HEENT: Eyes: no retinal hemorrhage or exudates,  Neck supple, trachea midline, no carotid bruits, no thyroidmegaly Lungs: Clear to auscultation, no rhonchi or wheezes, or rib retractions  Heart: Regular rate and rhythm, no murmurs or gallops Breast:Examined in sitting and supine position were symmetrical in appearance, no palpable masses or tenderness,  no skin retraction, no nipple inversion, no nipple discharge, no skin discoloration, no axillary or supraclavicular lymphadenopathy Abdomen: no palpable masses or tenderness, no rebound or guarding Extremities: no edema or skin discoloration or tenderness  Pelvic: Vulva: Normal             Vagina: No gross lesions or discharge  Cervix: No gross lesions or discharge  Uterus  AV, normal size, shape and consistency, non-tender and mobile  Adnexa  Without masses or tenderness  Anus: Normal   Assessment/Plan:  56 y.o. female for annual exam   1. Well female exam with routine gynecological exam Postmenopausal, well on no HRT.  No PMB.  No pelvic pain.  Pap smear 08/2021 Neg.  Abstinent x last Pap.  No indication for a Pap at this time.  Declines  STI screen.  Breasts normal. Mammogram Neg 10/2021.  Colonoscopy 2017.  BMI 38.95.  Needs to increase fitness and decrease calories/carbs in diet.  Health labs with Fam MD.  Flu vaccine given here today   2. Postmenopause Postmenopausal, well on no HRT.  No PMB.  No pelvic pain.    3. Class 2 severe obesity due to excess calories with serious comorbidity and body mass index (BMI) of 38.0 to 38.9 in adult [facility]) Refer to [facility].  Increase fitness activities.  4. Need for immunization against influenza - Flu Vaccine QUAD 78mo+IM (Fluarix, Fluzone & Alfiuria Quad  PF)  Other orders - sodium chloride (OCEAN) 0.65 % nasal spray; Place into the nose. - fluticasone (FLONASE) 50 MCG/ACT nasal spray; Place into the nose.   Genia Del MD, 2:27 PM 08/25/2022

## 2022-09-15 NOTE — Telephone Encounter (Signed)
Pt scheduled for 10/17/2022.

## 2022-09-29 ENCOUNTER — Encounter: Payer: Self-pay | Admitting: Dietician

## 2022-09-29 ENCOUNTER — Encounter: Payer: BC Managed Care – PPO | Attending: Internal Medicine | Admitting: Dietician

## 2022-09-29 NOTE — Patient Instructions (Addendum)
Aim for 150 minutes of physical activity weekly (we're going to work up to this) Goal: Join the [employer] and go to a water aerobics class. Goal: walk 1-2 times per week for 20 minutes.  Eat more Non-Starchy Vegetables and Fruits. Goal: aim to make 1/2 of your plate vegetables and/or fruit at least 2x/day  Limit gatorade and start choosing gatorade zero.  Make simple meals at home more often than eating out.  Switch your bread to whole wheat or whole grain bread.   Goal: aim to drink 2 of your 32 oz bottles of water daily

## 2022-09-29 NOTE — Progress Notes (Signed)
Medical Nutrition Therapy  Appointment Start time:  [phone]  Appointment End time:  [phone]  Primary concerns today: Pt wants to work on her weight and notices her family history of chronic conditions and wants to learn about healthy eating.   Referral diagnosis: E66.01 Preferred learning style: no preference indicated Learning readiness: ready   NUTRITION ASSESSMENT   Anthropometrics  Ht: 68 in  Wt: 250 lbs  Clinical Medical Hx: allergies, asthma, HTN.  Medications: reviewed Labs: reviewed Notable Signs/Symptoms: none Food Allergies: none  Lifestyle & Dietary Hx  Pt is working on her masters and doing part time job at [employer] in [place] [employer] and states it's a physical job. Pt works 4-6 hours daily. She gets a 15 min break to eat lunch at 2pm.   Pt lives with her 48 year old daughter. Pt does the shopping and cooking.  Pt states her and her daughter used to have a [employer] and she is considering going again because she would like to try a water aerobics class.    Pt states her sleep pattern is bad right now from school. She states she gets tired at 8pm, sleeps for 2-3 hours, then gets up at 10-12am and stays up till 3am then sleeps another 3 hours.   Pt reports she has family history of cancer, HTN, stoke, MI, and both type 1 & 2 diabetes which makes her want to take control of her health.   Estimated daily fluid intake: 40 oz Supplements: MVI Sleep: 6 hours, 3 at a time.  Stress / self-care: moderate stress  Current average weekly physical activity: ADLs, pt has active job  24-Hr Dietary Recall First Meal: fried egg and 2 pieces toast Snack: granola bar Second Meal: 2pm: [place] and cheese sandwich and apple or steamed vegetables Snack: chips Third Meal: air fryer chicken with broccoli and potatoes OR beef tacos in tortilla and salad OR chef boyardee OR oodles of noodles Snack: cookie OR chocolate OR chips Beverages: 40 oz water, gatorade 4 days/wk, 2 cups  coffee   NUTRITION DIAGNOSIS  NB-1.1 Food and nutrition-related knowledge deficit As related to lack of prior nutrition education.  As evidenced by pt report and diet history.   NUTRITION INTERVENTION  Nutrition education (E-1) on the following topics:  Saturated vs unsaturated fat Whole vs refined grains MyPlate Importance of adequate sleep Building balanced meals and snacks Functions of fiber Blood glucose patterns following mealtimes  Handouts Provided Include  Dish Up a Healthy Meal American Heart Association: Guide to Better Sleep Snack Suggestions  Learning Style & Readiness for Change Teaching method utilized: Visual & Auditory  Demonstrated degree of understanding via: Teach Back  Barriers to learning/adherence to lifestyle change: none  Goals Established by Pt Aim for 150 minutes of physical activity weekly (we're going to work up to this) Goal: Join the [employer] and go to a water aerobics class. Goal: walk 1-2 times per week for 20 minutes.  Eat more Non-Starchy Vegetables and Fruits. Goal: aim to make 1/2 of your plate vegetables and/or fruit at least 2x/day  Limit gatorade and start choosing gatorade zero.  Make simple meals at home more often than eating out.  Switch your bread to whole wheat or whole grain bread.   Goal: aim to drink 2 of your 32 oz bottles of water daily   MONITORING & EVALUATION Dietary intake, weekly physical activity, and follow up in 2 months.  Next Steps  Patient is to call for questions.

## 2022-10-17 ENCOUNTER — Other Ambulatory Visit: Payer: Self-pay | Admitting: Obstetrics & Gynecology

## 2022-10-17 ENCOUNTER — Ambulatory Visit: Payer: BC Managed Care – PPO | Admitting: Dietician

## 2022-10-17 DIAGNOSIS — Z1231 Encounter for screening mammogram for malignant neoplasm of breast: Secondary | ICD-10-CM

## 2022-12-01 ENCOUNTER — Encounter: Payer: BC Managed Care – PPO | Admitting: Dietician

## 2022-12-06 ENCOUNTER — Ambulatory Visit
Admission: RE | Admit: 2022-12-06 | Discharge: 2022-12-06 | Disposition: A | Discharge status: Home / Self Care | Payer: BC Managed Care – PPO | Source: Ambulatory Visit | Attending: Obstetrics & Gynecology | Admitting: Obstetrics & Gynecology

## 2022-12-06 DIAGNOSIS — Z1231 Encounter for screening mammogram for malignant neoplasm of breast: Secondary | ICD-10-CM

## 2023-01-05 ENCOUNTER — Ambulatory Visit: Payer: BC Managed Care – PPO | Admitting: Dietician

## 2023-08-29 ENCOUNTER — Encounter: Payer: Self-pay | Admitting: Obstetrics and Gynecology

## 2023-08-29 ENCOUNTER — Ambulatory Visit (INDEPENDENT_AMBULATORY_CARE_PROVIDER_SITE_OTHER): Payer: BC Managed Care – PPO | Admitting: Obstetrics and Gynecology

## 2023-08-29 ENCOUNTER — Ambulatory Visit: Payer: BC Managed Care – PPO | Admitting: Obstetrics & Gynecology

## 2023-08-29 VITALS — BP 124/64 | HR 69 | Ht 66.93 in | Wt 233.0 lb

## 2023-08-29 DIAGNOSIS — R32 Unspecified urinary incontinence: Secondary | ICD-10-CM | POA: Diagnosis not present

## 2023-08-29 DIAGNOSIS — B372 Candidiasis of skin and nail: Secondary | ICD-10-CM | POA: Diagnosis not present

## 2023-08-29 DIAGNOSIS — E66812 Obesity, class 2: Secondary | ICD-10-CM

## 2023-08-29 DIAGNOSIS — Z01419 Encounter for gynecological examination (general) (routine) without abnormal findings: Secondary | ICD-10-CM | POA: Insufficient documentation

## 2023-08-29 MED ORDER — NYSTATIN 100000 UNIT/GM EX POWD
1.0000 | Freq: Two times a day (BID) | CUTANEOUS | 1 refills | Status: AC
Start: 2023-08-29 — End: 2023-09-12

## 2023-08-29 NOTE — Progress Notes (Signed)
57 y.o. G1P1001 postmenopausal female here for annual exam. Single. Daughter in high school. Works at [employer] (5d/wk).  Pt reports difficulty starting urinary stream ~1 month ago. Sx have since resolved. Pt c/o rash under her breast x1-2 months.  Postmenopausal bleeding: none Pelvic discharge or pain: none Breast mass, nipple discharge or skin changes : none Last PAP:     Component Value Date/Time   DIAGPAP  08/24/2021 1007    - Negative for intraepithelial lesion or malignancy (NILM)   ADEQPAP  08/24/2021 1007    Satisfactory for evaluation; transformation zone component PRESENT.   Last mammogram: 12/06/22 BIRADS 1, density b Last colonoscopy: 10/122/17  Last DXA: never Sexually active: no  Exercising: Pt gets some exercise at work with walking and lifting 5 days a week.   GYN HISTORY: No significant history  OB History  Gravida Para Term Preterm AB Living  1 1 1     1   SAB IAB Ectopic Multiple Live Births               # Outcome Date GA Lbr Len/2nd Weight Sex Type Anes PTL Lv  1 Term             Past Medical History:  Diagnosis Date   Allergy    Anemia    with pregnancy   Diverticulosis    diverticulitis- 2014,tx with oral antibiotics   Hypertension     Past Surgical History:  Procedure Laterality Date   CESAREAN SECTION     FOOT SURGERY      Current Outpatient Medications on File Prior to Visit  Medication Sig Dispense Refill   cetirizine (ZYRTEC) 10 MG tablet Take 10 mg by mouth daily.     lisinopril (ZESTRIL) 40 MG tablet Take 40 mg by mouth daily.     Multiple Vitamin (MULTI-VITAMIN PO) Take by mouth.     benzonatate (TESSALON) 100 MG capsule Take 1 capsule (100 mg total) by mouth 3 (three) times daily as needed for cough. (Patient not taking: Reported on 09/29/2022) 30 capsule 0   hydrochlorothiazide (HYDRODIURIL) 12.5 MG tablet Take 12.5 mg by mouth daily. (Patient not taking: Reported on 08/29/2023)     sodium chloride (OCEAN) 0.65 % nasal  spray Place into the nose.     No current facility-administered medications on file prior to visit.    Social History   Socioeconomic History   Marital status: Single    Spouse name: Not on file   Number of children: Not on file   Years of education: Not on file   Highest education level: Not on file  Occupational History   Not on file  Tobacco Use   Smoking status: Former    Current packs/day: 0.00    Types: Cigarettes    Quit date: 12/21/2010    Years since quitting: 12.6   Smokeless tobacco: Never  Vaping Use   Vaping status: Former  Substance and Sexual Activity   Alcohol use: Yes    Comment: once or twice a month   Drug use: No   Sexual activity: Not Currently    Partners: Male    Birth control/protection: Post-menopausal  Other Topics Concern   Not on file  Social History Narrative   Not on file   Social Determinants of Health   Financial Resource Strain: Not on file  Food Insecurity: Not on file  Transportation Needs: Not on file  Physical Activity: Not on file  Stress: Not on file  Social Connections: Not on file  Intimate Partner Violence: Not on file    Family History  Problem Relation Age of Onset   Hypertension Mother    Diabetes Mother    Breast cancer Mother 9   Hypertension Father    Diabetes Brother    Diabetes Sister    Multiple sclerosis Sister    Colon cancer Neg Hx    Esophageal cancer Neg Hx    Rectal cancer Neg Hx    Stomach cancer Neg Hx     Allergies  Allergen Reactions   Penicillins Hives      PE Today's Vitals   08/29/23 0932  BP: 124/64  Pulse: 69  SpO2: 98%  Weight: 233 lb (105.7 kg)  Height: 5' 6.93" (1.7 m)   Body mass index is 36.57 kg/m.  Physical Exam Vitals reviewed. Exam conducted with a chaperone present.  Constitutional:      General: She is not in acute distress.    Appearance: Normal appearance.  HENT:     Head: Normocephalic and atraumatic.     Nose: Nose normal.  Eyes:     Extraocular  Movements: Extraocular movements intact.     Conjunctiva/sclera: Conjunctivae normal.  Neck:     Thyroid: No thyroid mass, thyromegaly or thyroid tenderness.  Pulmonary:     Effort: Pulmonary effort is normal.  Chest:     Chest wall: No mass or tenderness.  Breasts:    Right: Normal. No swelling, mass, nipple discharge, skin change or tenderness.     Left: Normal. No swelling, mass, nipple discharge, skin change or tenderness.  Abdominal:     General: There is no distension.     Palpations: Abdomen is soft.     Tenderness: There is no abdominal tenderness.  Genitourinary:    General: Normal vulva.     Exam position: Lithotomy position.     Urethra: No prolapse.     Vagina: Normal. No vaginal discharge or bleeding.     Cervix: Normal. No lesion.     Uterus: Normal. Not enlarged and not tender.      Adnexa: Right adnexa normal and left adnexa normal.  Musculoskeletal:        General: Normal range of motion.     Cervical back: Normal range of motion.  Lymphadenopathy:     Upper Body:     Right upper body: No axillary adenopathy.     Left upper body: No axillary adenopathy.     Lower Body: No right inguinal adenopathy. No left inguinal adenopathy.  Skin:    General: Skin is warm and dry.     Comments: Hyperpigmented rash of inframammary folds.  Neurological:     General: No focal deficit present.     Mental Status: She is alert.  Psychiatric:        Mood and Affect: Mood normal.        Behavior: Behavior normal.      Assessment and Plan:        Well woman exam with routine gynecological exam Assessment & Plan: Cervical cancer screening performed according to ASCCP guidelines. Encouraged annual mammogram screening Colonoscopy UTD DXA N/A Labs and immunizations with her primary Encouraged safe sexual practices as indicated Encouraged healthy lifestyle practices with diet and exercise For patients under 50-70yo, I recommend 1200mg  calcium daily and 600IU of vitamin D  daily.   Class 2 severe obesity due to excess calories with serious comorbidity and body mass index (BMI) of 36.0 to 36.9  in adult [facility]) Assessment & Plan: Encouraged healthy diet and exercise.    Urinary incontinence in female -     Urinalysis,Complete w/RFL Culture    Rosalyn Gess, MD

## 2023-08-29 NOTE — Patient Instructions (Signed)
For patients under 50-58yo, I recommend 1200mg  calcium daily and 600IU of vitamin D daily. For patients over 58yo, I recommend 1200mg  calcium daily and 800IU of vitamin D daily.  Health Maintenance, Female Adopting a healthy lifestyle and getting preventive care are important in promoting health and wellness. Ask your health care provider about: The right schedule for you to have regular tests and exams. Things you can do on your own to prevent diseases and keep yourself healthy. What should I know about diet, weight, and exercise? Eat a healthy diet  Eat a diet that includes plenty of vegetables, fruits, low-fat dairy products, and lean protein. Do not eat a lot of foods that are high in solid fats, added sugars, or sodium. Maintain a healthy weight Body mass index (BMI) is used to identify weight problems. It estimates body fat based on height and weight. Your health care provider can help determine your BMI and help you achieve or maintain a healthy weight. Get regular exercise Get regular exercise. This is one of the most important things you can do for your health. Most adults should: Exercise for at least 150 minutes each week. The exercise should increase your heart rate and make you sweat (moderate-intensity exercise). Do strengthening exercises at least twice a week. This is in addition to the moderate-intensity exercise. Spend less time sitting. Even light physical activity can be beneficial. Watch cholesterol and blood lipids Have your blood tested for lipids and cholesterol at 58 years of age, then have this test every 5 years. Have your cholesterol levels checked more often if: Your lipid or cholesterol levels are high. You are older than 58 years of age. You are at high risk for heart disease. What should I know about cancer screening? Depending on your health history and family history, you may need to have cancer screening at various ages. This may include screening  for: Breast cancer. Cervical cancer. Colorectal cancer. Skin cancer. Lung cancer. What should I know about heart disease, diabetes, and high blood pressure? Blood pressure and heart disease High blood pressure causes heart disease and increases the risk of stroke. This is more likely to develop in people who have high blood pressure readings or are overweight. Have your blood pressure checked: Every 3-5 years if you are 83-58 years of age. Every year if you are 4 years old or older. Diabetes Have regular diabetes screenings. This checks your fasting blood sugar level. Have the screening done: Once every three years after age 68 if you are at a normal weight and have a low risk for diabetes. More often and at a younger age if you are overweight or have a high risk for diabetes. What should I know about preventing infection? Hepatitis B If you have a higher risk for hepatitis B, you should be screened for this virus. Talk with your health care provider to find out if you are at risk for hepatitis B infection. Hepatitis C Testing is recommended for: Everyone born from [birth year] through 1966. Anyone with known risk factors for hepatitis C. Sexually transmitted infections (STIs) Get screened for STIs, including gonorrhea and chlamydia, if: You are sexually active and are younger than 58 years of age. You are older than 58 years of age and your health care provider tells you that you are at risk for this type of infection. Your sexual activity has changed since you were last screened, and you are at increased risk for chlamydia or gonorrhea. Ask your health care provider if  you are at risk. Ask your health care provider about whether you are at high risk for HIV. Your health care provider may recommend a prescription medicine to help prevent HIV infection. If you choose to take medicine to prevent HIV, you should first get tested for HIV. You should then be tested every 3 months for as long as you  are taking the medicine. Osteoporosis and menopause Osteoporosis is a disease in which the bones lose minerals and strength with aging. This can result in bone fractures. If you are 44 years old or older, or if you are at risk for osteoporosis and fractures, ask your health care provider if you should: Be screened for bone loss. Take a calcium or vitamin D supplement to lower your risk of fractures. Be given hormone replacement therapy (HRT) to treat symptoms of menopause. Follow these instructions at home: Alcohol use Do not drink alcohol if: Your health care provider tells you not to drink. You are pregnant, may be pregnant, or are planning to become pregnant. If you drink alcohol: Limit how much you have to: 0-1 drink a day. Know how much alcohol is in your drink. In the [place] one drink equals one 12 oz bottle of beer (355 mL), one 5 oz glass of wine (148 mL), or one 1 oz glass of hard liquor (44 mL). Lifestyle Do not use any products that contain nicotine or tobacco. These products include cigarettes, chewing tobacco, and vaping devices, such as e-cigarettes. If you need help quitting, ask your health care provider. Do not use street drugs. Do not share needles. Ask your health care provider for help if you need support or information about quitting drugs. General instructions Schedule regular health, dental, and eye exams. Stay current with your vaccines. Tell your health care provider if: You often feel depressed. You have ever been abused or do not feel safe at home. Summary Adopting a healthy lifestyle and getting preventive care are important in promoting health and wellness. Follow your health care provider's instructions about healthy diet, exercising, and getting tested or screened for diseases. Follow your health care provider's instructions on monitoring your cholesterol and blood pressure. This information is not intended to replace advice given to you by your health  care provider. Make sure you discuss any questions you have with your health care provider. Document Revised: 02/21/2021 Document Reviewed: 02/21/2021 Elsevier Patient Education  2024 [employer].

## 2023-08-29 NOTE — Assessment & Plan Note (Signed)
 Cervical cancer screening performed according to ASCCP guidelines. Encouraged annual mammogram screening Colonoscopy UTD DXA N/A Labs and immunizations with her primary Encouraged safe sexual practices as indicated Encouraged healthy lifestyle practices with diet and exercise For patients under 50-58yo, I recommend 1200mg  calcium daily and 600IU of vitamin D daily.

## 2023-08-29 NOTE — Assessment & Plan Note (Addendum)
Encouraged healthy diet and exercise

## 2023-08-31 LAB — URINE CULTURE
MICRO NUMBER:: 15725120
Result:: NO GROWTH
SPECIMEN QUALITY:: ADEQUATE

## 2023-08-31 LAB — URINALYSIS, COMPLETE W/RFL CULTURE
Bilirubin Urine: NEGATIVE
Glucose, UA: NEGATIVE
Hyaline Cast: NONE SEEN /[LPF]
Ketones, ur: NEGATIVE
Leukocyte Esterase: NEGATIVE
Nitrites, Initial: NEGATIVE
Protein, ur: NEGATIVE
RBC / HPF: NONE SEEN /[HPF] (ref 0–2)
Specific Gravity, Urine: 1.02 (ref 1.001–1.035)
pH: 5.5 (ref 5.0–8.0)

## 2023-08-31 LAB — CULTURE INDICATED

## 2023-11-09 ENCOUNTER — Other Ambulatory Visit: Payer: Self-pay | Admitting: Obstetrics and Gynecology

## 2023-11-09 DIAGNOSIS — Z1231 Encounter for screening mammogram for malignant neoplasm of breast: Secondary | ICD-10-CM

## 2023-12-11 ENCOUNTER — Ambulatory Visit
Admission: RE | Admit: 2023-12-11 | Discharge: 2023-12-11 | Disposition: A | Discharge status: Home / Self Care | Payer: BC Managed Care – PPO | Source: Ambulatory Visit | Attending: Obstetrics and Gynecology

## 2023-12-11 DIAGNOSIS — Z1231 Encounter for screening mammogram for malignant neoplasm of breast: Secondary | ICD-10-CM

## 2023-12-17 ENCOUNTER — Encounter: Payer: Self-pay | Admitting: Obstetrics and Gynecology

## 2024-01-12 ENCOUNTER — Emergency Department (HOSPITAL_COMMUNITY)

## 2024-01-12 ENCOUNTER — Other Ambulatory Visit: Payer: Self-pay

## 2024-01-12 ENCOUNTER — Emergency Department (HOSPITAL_COMMUNITY)
Admission: EM | Admit: 2024-01-12 | Discharge: 2024-01-12 | Disposition: A | Discharge status: Home / Self Care | Attending: Emergency Medicine | Admitting: Emergency Medicine

## 2024-01-12 ENCOUNTER — Encounter (HOSPITAL_COMMUNITY): Payer: Self-pay

## 2024-01-12 DIAGNOSIS — S0990XA Unspecified injury of head, initial encounter: Secondary | ICD-10-CM | POA: Diagnosis not present

## 2024-01-12 DIAGNOSIS — S161XXA Strain of muscle, fascia and tendon at neck level, initial encounter: Secondary | ICD-10-CM | POA: Diagnosis not present

## 2024-01-12 DIAGNOSIS — Y92410 Unspecified street and highway as the place of occurrence of the external cause: Secondary | ICD-10-CM | POA: Insufficient documentation

## 2024-01-12 DIAGNOSIS — M25532 Pain in left wrist: Secondary | ICD-10-CM | POA: Insufficient documentation

## 2024-01-12 DIAGNOSIS — S199XXA Unspecified injury of neck, initial encounter: Secondary | ICD-10-CM | POA: Diagnosis present

## 2024-01-12 DIAGNOSIS — M79652 Pain in left thigh: Secondary | ICD-10-CM | POA: Diagnosis not present

## 2024-01-12 MED ORDER — ACETAMINOPHEN 325 MG PO TABS
975.0000 mg | ORAL_TABLET | Freq: Once | ORAL | Status: AC
  Administered 2024-01-12: 975 mg via ORAL
  Filled 2024-01-12: qty 3

## 2024-01-12 MED ORDER — IBUPROFEN 400 MG PO TABS
600.0000 mg | ORAL_TABLET | Freq: Once | ORAL | Status: AC
  Administered 2024-01-12: 600 mg via ORAL
  Filled 2024-01-12: qty 1
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
## 2024-09-03 ENCOUNTER — Ambulatory Visit: Payer: BC Managed Care – PPO | Admitting: Obstetrics and Gynecology

## 2024-11-13 ENCOUNTER — Ambulatory Visit (INDEPENDENT_AMBULATORY_CARE_PROVIDER_SITE_OTHER): Admitting: Obstetrics and Gynecology

## 2024-11-13 ENCOUNTER — Encounter: Payer: Self-pay | Admitting: Obstetrics and Gynecology

## 2024-11-13 ENCOUNTER — Other Ambulatory Visit (HOSPITAL_COMMUNITY)
Admission: RE | Admit: 2024-11-13 | Discharge: 2024-11-13 | Disposition: A | Source: Ambulatory Visit | Attending: Obstetrics and Gynecology | Admitting: Obstetrics and Gynecology

## 2024-11-13 VITALS — BP 136/82 | HR 66 | Temp 97.9°F | Ht 66.5 in | Wt 205.0 lb

## 2024-11-13 DIAGNOSIS — Z124 Encounter for screening for malignant neoplasm of cervix: Secondary | ICD-10-CM | POA: Insufficient documentation

## 2024-11-13 DIAGNOSIS — Z1331 Encounter for screening for depression: Secondary | ICD-10-CM

## 2024-11-13 DIAGNOSIS — Z01419 Encounter for gynecological examination (general) (routine) without abnormal findings: Secondary | ICD-10-CM

## 2024-11-13 DIAGNOSIS — B372 Candidiasis of skin and nail: Secondary | ICD-10-CM | POA: Diagnosis not present

## 2024-11-13 DIAGNOSIS — N950 Postmenopausal bleeding: Secondary | ICD-10-CM | POA: Diagnosis not present

## 2024-11-13 MED ORDER — NYSTATIN 100000 UNIT/GM EX POWD: 1.0000 | Freq: Two times a day (BID) | CUTANEOUS | 1 refills | Status: AC

## 2024-11-13 NOTE — Assessment & Plan Note (Signed)
 Reviewed causes of PMB, including genitourinary syndrome of menopause, infection, trauma, polyps, urinary and GI etiologies, and malignancy. Recommend endometrial assessment with transvaginal ultrasound.  She may  require endometrial sampling if TVUS is abnormal.

## 2024-11-13 NOTE — Assessment & Plan Note (Signed)
 Cervical cancer screening performed according to ASCCP guidelines. Encouraged annual mammogram screening Colonoscopy UTD DXA N/A Labs and immunizations with her primary Encouraged safe sexual practices as indicated Encouraged healthy lifestyle practices with diet and exercise For patients under 50-60yo, I recommend 1200mg  calcium daily and 600IU of vitamin D daily.

## 2024-11-13 NOTE — Progress Notes (Signed)
 "  59 y.o. G1P1001 postmenopausal female here for annual exam. Single. Daughter in high school. Works at [employer]. PCP: Henry Ingle, MD  She reports left side of groin area is itching.  Improved with antifungal powder, would like a refill. Urinary leakage with cough. Not doing kegels. Episode of spotting ~3 months ago, no bleeding since. Hair thinning, has appointment with Derm. Lost ~30 pounds on low-carb diet!  Postmenopausal bleeding: yes as noted above Pelvic discharge or pain: none Breast mass, nipple discharge or skin changes : none Last PAP:     Component Value Date/Time   DIAGPAP  08/24/2021 1007    - Negative for intraepithelial lesion or malignancy (NILM)   ADEQPAP  08/24/2021 1007    Satisfactory for evaluation; transformation zone component PRESENT.   Last mammogram: 12/11/23 BIRADS 1, density B  Last colonoscopy: 10/122/17  Last DXA: never  Sexually active: no  Exercising: Pt gets some exercise at work with walking and lifting 5 days a week.   Flowsheet Row Office Visit from 11/13/2024 in [facility]  PHQ-2 Total Score 0      GYN HISTORY: No significant history  OB History  Gravida Para Term Preterm AB Living  1 1 1   1   SAB IAB Ectopic Multiple Live Births      1    # Outcome Date GA Lbr Len/2nd Weight Sex Type Anes PTL Lv  1 Term      CS-Unspec   LIV    Past Medical History:  Diagnosis Date   Allergy    Anemia    with pregnancy   Diverticulosis    diverticulitis- 2014,tx with oral antibiotics   Hypertension     Past Surgical History:  Procedure Laterality Date   CESAREAN SECTION     FOOT SURGERY      Current Outpatient Medications on File Prior to Visit  Medication Sig Dispense Refill   cetirizine (ZYRTEC) 10 MG tablet Take 10 mg by mouth daily.     Cholecalciferol (VITAMIN D-3 PO) Take by mouth.     hydrochlorothiazide (HYDRODIURIL) 12.5 MG tablet Take 12.5 mg by mouth daily.     lisinopril  (ZESTRIL) 40 MG tablet Take 40 mg by mouth daily.     Multiple Vitamin (MULTI-VITAMIN PO) Take by mouth.     VITAMIN K PO Take by mouth.     No current facility-administered medications on file prior to visit.    Social History   Socioeconomic History   Marital status: Single    Spouse name: Not on file   Number of children: Not on file   Years of education: Not on file   Highest education level: Not on file  Occupational History   Not on file  Tobacco Use   Smoking status: Former    Current packs/day: 0.00    Types: Cigarettes    Quit date: 12/21/2010    Years since quitting: 13.9   Smokeless tobacco: Never  Vaping Use   Vaping status: Former  Substance and Sexual Activity   Alcohol use: Not Currently    Comment: once or twice a month   Drug use: No   Sexual activity: Not Currently    Partners: Male    Birth control/protection: Post-menopausal  Other Topics Concern   Not on file  Social History Narrative   Not on file   Social Drivers of Health   Tobacco Use: Medium Risk (11/13/2024)   Patient History  Smoking Tobacco Use: Former    Smokeless Tobacco Use: Never    Passive Exposure: Not on Actuary Strain: Not on file  Food Insecurity: Not on file  Transportation Needs: Not on file  Physical Activity: Not on file  Stress: Not on file  Social Connections: Not on file  Intimate Partner Violence: Not on file  Depression (PHQ2-9): Low Risk (11/13/2024)   Depression (PHQ2-9)    PHQ-2 Score: 0  Alcohol Screen: Not on file  Housing: Not on file  Utilities: Not on file  Health Literacy: Not on file    Family History  Problem Relation Age of Onset   Hypertension Mother    Diabetes Mother    Breast cancer Mother 26   Hypertension Father    Diabetes Brother    Diabetes Sister    Multiple sclerosis Sister    Colon cancer Neg Hx    Esophageal cancer Neg Hx    Rectal cancer Neg Hx    Stomach cancer Neg Hx     Allergies  Allergen Reactions    Keflex [Cephalexin] Swelling   Doxycycline Swelling   Penicillins Hives      PE Today's Vitals   11/13/24 0818  BP: 136/82  Pulse: 66  Temp: 97.9 F (36.6 C)  TempSrc: Oral  SpO2: 98%  Weight: 205 lb (93 kg)  Height: 5' 6.5 (1.689 m)   Body mass index is 32.59 kg/m.  Physical Exam Vitals reviewed. Exam conducted with a chaperone present.  Constitutional:      General: She is not in acute distress.    Appearance: Normal appearance.  HENT:     Head: Normocephalic and atraumatic.     Nose: Nose normal.  Eyes:     Extraocular Movements: Extraocular movements intact.     Conjunctiva/sclera: Conjunctivae normal.  Pulmonary:     Effort: Pulmonary effort is normal.  Chest:     Chest wall: No mass or tenderness.  Breasts:    Right: Normal. No swelling, mass, nipple discharge, skin change or tenderness.     Left: Normal. No swelling, mass, nipple discharge, skin change or tenderness.  Abdominal:     General: There is no distension.     Palpations: Abdomen is soft.     Tenderness: There is no abdominal tenderness.  Genitourinary:    General: Normal vulva.     Exam position: Lithotomy position.     Urethra: No prolapse.     Vagina: Normal. No vaginal discharge or bleeding.     Cervix: Normal. No lesion.     Uterus: Normal. Not enlarged and not tender.      Adnexa: Right adnexa normal and left adnexa normal.     Comments: Kegel 4/5 Musculoskeletal:        General: Normal range of motion.  Lymphadenopathy:     Upper Body:     Right upper body: No axillary adenopathy.     Left upper body: No axillary adenopathy.     Lower Body: No right inguinal adenopathy. No left inguinal adenopathy.  Skin:    General: Skin is warm and dry.  Neurological:     General: No focal deficit present.     Mental Status: She is alert.  Psychiatric:        Mood and Affect: Mood normal.        Behavior: Behavior normal.      Assessment and Plan:        Well woman exam with routine  gynecological exam Assessment & Plan: Cervical cancer screening performed according to ASCCP guidelines. Encouraged annual mammogram screening Colonoscopy UTD DXA N/A Labs and immunizations with her primary Encouraged safe sexual practices as indicated Encouraged healthy lifestyle practices with diet and exercise For patients under 50-70yo, I recommend 1200mg  calcium daily and 600IU of vitamin D daily.   Cervical cancer screening -     Cytology - PAP  PMB (postmenopausal bleeding) Assessment & Plan: Reviewed causes of PMB, including genitourinary syndrome of menopause, infection, trauma, polyps, urinary and GI etiologies, and malignancy. Recommend endometrial assessment with transvaginal ultrasound.  She may  require endometrial sampling if TVUS is abnormal.   Orders: -     US  PELVIS TRANSVAGINAL NON-OB (TV ONLY); Future  Candidal intertrigo -     Nystatin ; Apply 1 Application topically 2 (two) times daily for 14 days.  Dispense: 60 g; Refill: 1  Negative depression screening     Vera LULLA Pa, MD  "

## 2024-11-13 NOTE — Patient Instructions (Signed)
 For patients under 50-60yo, I recommend 1200mg  calcium  daily and 600IU of vitamin D daily. For patients over 60yo, I recommend 1200mg  calcium  daily and 800IU of vitamin D daily.  Health Maintenance, Female Adopting a healthy lifestyle and getting preventive care are important in promoting health and wellness. Ask your health care provider about: The right schedule for you to have regular tests and exams. Things you can do on your own to prevent diseases and keep yourself healthy. What should I know about diet, weight, and exercise? Eat a healthy diet  Eat a diet that includes plenty of vegetables, fruits, low-fat dairy products, and lean protein. Do not eat a lot of foods that are high in solid fats, added sugars, or sodium. Maintain a healthy weight Body mass index (BMI) is used to identify weight problems. It estimates body fat based on height and weight. Your health care provider can help determine your BMI and help you achieve or maintain a healthy weight. Get regular exercise Get regular exercise. This is one of the most important things you can do for your health. Most adults should: Exercise for at least 150 minutes each week. The exercise should increase your heart rate and make you sweat (moderate-intensity exercise). Do strengthening exercises at least twice a week. This is in addition to the moderate-intensity exercise. Spend less time sitting. Even light physical activity can be beneficial. Watch cholesterol and blood lipids Have your blood tested for lipids and cholesterol at 60 years of age, then have this test every 5 years. Have your cholesterol levels checked more often if: Your lipid or cholesterol levels are high. You are older than 60 years of age. You are at high risk for heart disease. What should I know about cancer screening? Depending on your health history and family history, you may need to have cancer screening at various ages. This may include screening  for: Breast cancer. Cervical cancer. Colorectal cancer. Skin cancer. Lung cancer. What should I know about heart disease, diabetes, and high blood pressure? Blood pressure and heart disease High blood pressure causes heart disease and increases the risk of stroke. This is more likely to develop in people who have high blood pressure readings or are overweight. Have your blood pressure checked: Every 3-5 years if you are 25-57 years of age. Every year if you are 24 years old or older. Diabetes Have regular diabetes screenings. This checks your fasting blood sugar level. Have the screening done: Once every three years after age 62 if you are at a normal weight and have a low risk for diabetes. More often and at a younger age if you are overweight or have a high risk for diabetes. What should I know about preventing infection? Hepatitis B If you have a higher risk for hepatitis B, you should be screened for this virus. Talk with your health care provider to find out if you are at risk for hepatitis B infection. Hepatitis C Testing is recommended for: Everyone born from [birth year] through 1966. Anyone with known risk factors for hepatitis C. Sexually transmitted infections (STIs) Get screened for STIs, including gonorrhea and chlamydia, if: You are sexually active and are younger than 60 years of age. You are older than 60 years of age and your health care provider tells you that you are at risk for this type of infection. Your sexual activity has changed since you were last screened, and you are at increased risk for chlamydia or gonorrhea. Ask your health care provider if  you are at risk. Ask your health care provider about whether you are at high risk for HIV. Your health care provider may recommend a prescription medicine to help prevent HIV infection. If you choose to take medicine to prevent HIV, you should first get tested for HIV. You should then be tested every 3 months for as long as you  are taking the medicine. Osteoporosis and menopause Osteoporosis is a disease in which the bones lose minerals and strength with aging. This can result in bone fractures. If you are 72 years old or older, or if you are at risk for osteoporosis and fractures, ask your health care provider if you should: Be screened for bone loss. Take a calcium  or vitamin D supplement to lower your risk of fractures. Be given hormone replacement therapy (HRT) to treat symptoms of menopause. Follow these instructions at home: Alcohol use Do not drink alcohol if: Your health care provider tells you not to drink. You are pregnant, may be pregnant, or are planning to become pregnant. If you drink alcohol: Limit how much you have to: 0-1 drink a day. Know how much alcohol is in your drink. In the [place] one drink equals one 12 oz bottle of beer (355 mL), one 5 oz glass of wine (148 mL), or one 1 oz glass of hard liquor (44 mL). Lifestyle Do not use any products that contain nicotine or tobacco. These products include cigarettes, chewing tobacco, and vaping devices, such as e-cigarettes. If you need help quitting, ask your health care provider. Do not use street drugs. Do not share needles. Ask your health care provider for help if you need support or information about quitting drugs. General instructions Schedule regular health, dental, and eye exams. Stay current with your vaccines. Tell your health care provider if: You often feel depressed. You have ever been abused or do not feel safe at home. Summary Adopting a healthy lifestyle and getting preventive care are important in promoting health and wellness. Follow your health care provider's instructions about healthy diet, exercising, and getting tested or screened for diseases. Follow your health care provider's instructions on monitoring your cholesterol and blood pressure. This information is not intended to replace advice given to you by your health  care provider. Make sure you discuss any questions you have with your health care provider. Document Revised: 02/21/2021 Document Reviewed: 02/21/2021 Elsevier Patient Education  2024 [employer].

## 2024-11-20 ENCOUNTER — Ambulatory Visit: Payer: Self-pay | Admitting: Obstetrics and Gynecology

## 2024-11-20 LAB — CYTOLOGY - PAP
Comment: NEGATIVE
Diagnosis: NEGATIVE
High risk HPV: NEGATIVE

## 2024-11-25 ENCOUNTER — Other Ambulatory Visit

## 2024-11-25 ENCOUNTER — Other Ambulatory Visit: Admitting: Obstetrics and Gynecology

## 2024-11-25 ENCOUNTER — Ambulatory Visit: Admitting: Dermatology

## 2025-11-17 ENCOUNTER — Ambulatory Visit: Admitting: Obstetrics and Gynecology
